# Patient Record
Sex: Male | Born: 1975 | Race: Black or African American | Hispanic: No | Marital: Single | Smoking: Never smoker
Health system: Southern US, Community
[De-identification: ages and names within clinical notes are randomized; demographics above are authoritative.]

## PROBLEM LIST (undated history)

## (undated) DIAGNOSIS — F1290 Cannabis use, unspecified, uncomplicated: Secondary | ICD-10-CM

## (undated) DIAGNOSIS — I10 Essential (primary) hypertension: Secondary | ICD-10-CM

## (undated) DIAGNOSIS — S82142R Displaced bicondylar fracture of left tibia, subsequent encounter for open fracture type IIIA, IIIB, or IIIC with malunion: Secondary | ICD-10-CM

## (undated) DIAGNOSIS — M24572 Contracture, left ankle: Secondary | ICD-10-CM

---

## 2014-04-15 ENCOUNTER — Encounter (HOSPITAL_COMMUNITY): Payer: Self-pay | Admitting: Emergency Medicine

## 2014-04-15 ENCOUNTER — Emergency Department (HOSPITAL_COMMUNITY)
Admission: EM | Admit: 2014-04-15 | Discharge: 2014-04-15 | Disposition: A | Discharge status: Home / Self Care | Payer: Self-pay | Attending: Emergency Medicine | Admitting: Emergency Medicine

## 2014-04-15 ENCOUNTER — Emergency Department (HOSPITAL_COMMUNITY): Payer: Self-pay

## 2014-04-15 DIAGNOSIS — S0003XA Contusion of scalp, initial encounter: Secondary | ICD-10-CM | POA: Insufficient documentation

## 2014-04-15 DIAGNOSIS — S1093XA Contusion of unspecified part of neck, initial encounter: Principal | ICD-10-CM

## 2014-04-15 DIAGNOSIS — S0083XA Contusion of other part of head, initial encounter: Secondary | ICD-10-CM | POA: Insufficient documentation

## 2014-04-15 DIAGNOSIS — F17200 Nicotine dependence, unspecified, uncomplicated: Secondary | ICD-10-CM | POA: Insufficient documentation

## 2014-04-15 DIAGNOSIS — Z23 Encounter for immunization: Secondary | ICD-10-CM | POA: Insufficient documentation

## 2014-04-15 MED ORDER — TETANUS-DIPHTH-ACELL PERTUSSIS 5-2.5-18.5 LF-MCG/0.5 IM SUSP
0.5000 mL | Freq: Once | INTRAMUSCULAR | Status: DC
  Filled 2014-04-15: qty 0.5

## 2014-04-15 MED ORDER — HYDROCODONE-ACETAMINOPHEN 5-325 MG PO TABS
1.0000 | ORAL_TABLET | Freq: Once | ORAL | Status: DC
  Filled 2014-04-15: qty 1

## 2014-04-15 MED ORDER — HYDROCODONE-ACETAMINOPHEN 5-325 MG PO TABS: 1.0000 | ORAL_TABLET | Freq: Four times a day (QID) | ORAL | Status: DC | PRN

## 2014-04-15 NOTE — ED Notes (Signed)
Pt offered a wheelchair at discharge, refused, this RN escorted to lobby.  Pt alert and oriented at discharge with steady gait.  Pt family to give ride home.
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
## 2014-04-15 NOTE — ED Notes (Signed)
Pt paged x2, no answer

## 2014-08-09 ENCOUNTER — Emergency Department (HOSPITAL_COMMUNITY)
Admission: EM | Admit: 2014-08-09 | Discharge: 2014-08-09 | Disposition: A | Discharge status: Home / Self Care | Payer: Self-pay | Attending: Emergency Medicine | Admitting: Emergency Medicine

## 2014-08-09 ENCOUNTER — Emergency Department (HOSPITAL_COMMUNITY): Payer: Self-pay

## 2014-08-09 ENCOUNTER — Encounter (HOSPITAL_COMMUNITY): Payer: Self-pay | Admitting: Emergency Medicine

## 2014-08-09 DIAGNOSIS — Y9289 Other specified places as the place of occurrence of the external cause: Secondary | ICD-10-CM | POA: Insufficient documentation

## 2014-08-09 DIAGNOSIS — M25521 Pain in right elbow: Secondary | ICD-10-CM

## 2014-08-09 DIAGNOSIS — I10 Essential (primary) hypertension: Secondary | ICD-10-CM | POA: Insufficient documentation

## 2014-08-09 DIAGNOSIS — W01198A Fall on same level from slipping, tripping and stumbling with subsequent striking against other object, initial encounter: Secondary | ICD-10-CM | POA: Insufficient documentation

## 2014-08-09 DIAGNOSIS — Y9389 Activity, other specified: Secondary | ICD-10-CM | POA: Insufficient documentation

## 2014-08-09 DIAGNOSIS — Z720 Tobacco use: Secondary | ICD-10-CM | POA: Insufficient documentation

## 2014-08-09 DIAGNOSIS — S59902A Unspecified injury of left elbow, initial encounter: Secondary | ICD-10-CM | POA: Insufficient documentation

## 2014-08-09 MED ORDER — OXYCODONE-ACETAMINOPHEN 5-325 MG PO TABS
1.0000 | ORAL_TABLET | Freq: Once | ORAL | Status: AC
  Administered 2014-08-09: 1 via ORAL
  Filled 2014-08-09: qty 1

## 2014-08-09 MED ORDER — ONDANSETRON HCL 4 MG PO TABS: 4.0000 mg | ORAL_TABLET | Freq: Four times a day (QID) | ORAL | Status: DC

## 2014-08-09 MED ORDER — OXYCODONE-ACETAMINOPHEN 5-325 MG PO TABS
1.0000 | ORAL_TABLET | ORAL | Status: DC | PRN
Start: 2014-08-09 — End: 2016-01-30
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
## 2014-08-09 NOTE — ED Notes (Addendum)
Ortho tech notified of arm splint order.

## 2014-08-09 NOTE — Discharge Instructions (Signed)
Today your x-ray showed that you have swelling to your elbow. I have concerns that there is a fracture that we cannot see on the x-ray. You were placed in a splint in the emergency department. Please try keep your arm elevated as much as possible. Take the pain medication given by mouth for relief of her symptoms. Do not drive, operate heavy machinery, combine this medication with alcohol. Followup with the orthopedic doctor listed above. Return to the emergency department for worsening pain, numbness, or any symptom that is concerning to you.    Hypertension Hypertension is another name for high blood pressure. High blood pressure forces your heart to work harder to pump blood. A blood pressure reading has two numbers, which includes a higher number over a lower number (example: 110/72). HOME CARE   Have your blood pressure rechecked by your doctor.  Only take medicine as told by your doctor. Follow the directions carefully. The medicine does not work as well if you skip doses. Skipping doses also puts you at risk for problems.  Do not smoke.  Monitor your blood pressure at home as told by your doctor. GET HELP IF:  You think you are having a reaction to the medicine you are taking.  You have repeat headaches or feel dizzy.  You have puffiness (swelling) in your ankles.  You have trouble with your vision. GET HELP RIGHT AWAY IF:   You get a very bad headache and are confused.  You feel weak, numb, or faint.  You get chest or belly (abdominal) pain.  You throw up (vomit).  You cannot breathe very well. MAKE SURE YOU:   Understand these instructions.  Will watch your condition.  Will get help right away if you are not doing well or get worse. Document Released: 03/30/2008 Document Revised: 10/17/2013 Document Reviewed: 08/04/2013 [facility] Patient Information 2015 [place], [place]. This information is not intended to replace advice given to you by your health care provider.  Make sure you discuss any questions you have with your health care provider.
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
## 2014-08-09 NOTE — Progress Notes (Signed)
Orthopedic Tech Progress Note Patient Details:  [place] Code 1976-06-24 [id number] Applied to RUE. Tolerated well. Ortho Devices Type of Ortho Device: Ace wrap;Arm sling;Post (long arm) splint Ortho Device/Splint Location: RUE Ortho Device/Splint Interventions: Application   Asia R Thompson 08/09/2014, 10:46 AM

## 2014-08-09 NOTE — ED Provider Notes (Signed)
Medical screening examination/treatment/procedure(s) were performed by non-physician practitioner and as supervising physician I was immediately available for consultation/collaboration.   EKG Interpretation None        Wandra Arthurs, MD 08/09/14 1531

## 2014-08-09 NOTE — ED Notes (Signed)
Declined W/C at D/C and was escorted to lobby by RN. 

## 2015-09-07 ENCOUNTER — Inpatient Hospital Stay (HOSPITAL_COMMUNITY): Payer: Self-pay

## 2015-09-07 ENCOUNTER — Inpatient Hospital Stay (HOSPITAL_COMMUNITY): Payer: Self-pay | Admitting: Anesthesiology

## 2015-09-07 ENCOUNTER — Emergency Department (HOSPITAL_COMMUNITY): Payer: Self-pay

## 2015-09-07 ENCOUNTER — Inpatient Hospital Stay (HOSPITAL_COMMUNITY): Payer: MEDICAID

## 2015-09-07 ENCOUNTER — Inpatient Hospital Stay (HOSPITAL_COMMUNITY)
Admission: EM | Admit: 2015-09-07 | Discharge: 2015-10-03 | DRG: 493 | Disposition: A | Discharge status: Home / Self Care | Payer: Self-pay | Attending: General Surgery | Admitting: General Surgery

## 2015-09-07 ENCOUNTER — Encounter (HOSPITAL_COMMUNITY): Admission: EM | Disposition: A | Discharge status: Home / Self Care | Payer: Self-pay | Source: Home / Self Care

## 2015-09-07 ENCOUNTER — Encounter (HOSPITAL_COMMUNITY): Payer: Self-pay | Admitting: *Deleted

## 2015-09-07 DIAGNOSIS — K567 Ileus, unspecified: Secondary | ICD-10-CM

## 2015-09-07 DIAGNOSIS — R339 Retention of urine, unspecified: Secondary | ICD-10-CM | POA: Diagnosis not present

## 2015-09-07 DIAGNOSIS — D473 Essential (hemorrhagic) thrombocythemia: Secondary | ICD-10-CM | POA: Diagnosis not present

## 2015-09-07 DIAGNOSIS — S21132A Puncture wound without foreign body of left front wall of thorax without penetration into thoracic cavity, initial encounter: Secondary | ICD-10-CM

## 2015-09-07 DIAGNOSIS — S41132A Puncture wound without foreign body of left upper arm, initial encounter: Secondary | ICD-10-CM

## 2015-09-07 DIAGNOSIS — Z419 Encounter for procedure for purposes other than remedying health state, unspecified: Secondary | ICD-10-CM

## 2015-09-07 DIAGNOSIS — D62 Acute posthemorrhagic anemia: Secondary | ICD-10-CM | POA: Diagnosis not present

## 2015-09-07 DIAGNOSIS — S82109A Unspecified fracture of upper end of unspecified tibia, initial encounter for closed fracture: Secondary | ICD-10-CM

## 2015-09-07 DIAGNOSIS — S21139A Puncture wound without foreign body of unspecified front wall of thorax without penetration into thoracic cavity, initial encounter: Secondary | ICD-10-CM

## 2015-09-07 DIAGNOSIS — S82832A Other fracture of upper and lower end of left fibula, initial encounter for closed fracture: Secondary | ICD-10-CM | POA: Diagnosis present

## 2015-09-07 DIAGNOSIS — S81832A Puncture wound without foreign body, left lower leg, initial encounter: Secondary | ICD-10-CM

## 2015-09-07 DIAGNOSIS — S82202A Unspecified fracture of shaft of left tibia, initial encounter for closed fracture: Secondary | ICD-10-CM

## 2015-09-07 DIAGNOSIS — S41131A Puncture wound without foreign body of right upper arm, initial encounter: Secondary | ICD-10-CM

## 2015-09-07 DIAGNOSIS — Z09 Encounter for follow-up examination after completed treatment for conditions other than malignant neoplasm: Secondary | ICD-10-CM

## 2015-09-07 DIAGNOSIS — W3400XA Accidental discharge from unspecified firearms or gun, initial encounter: Secondary | ICD-10-CM

## 2015-09-07 DIAGNOSIS — Z590 Homelessness: Secondary | ICD-10-CM

## 2015-09-07 DIAGNOSIS — S82142A Displaced bicondylar fracture of left tibia, initial encounter for closed fracture: Principal | ICD-10-CM | POA: Diagnosis present

## 2015-09-07 DIAGNOSIS — R000 Tachycardia, unspecified: Secondary | ICD-10-CM

## 2015-09-07 DIAGNOSIS — S82143A Displaced bicondylar fracture of unspecified tibia, initial encounter for closed fracture: Secondary | ICD-10-CM

## 2015-09-07 DIAGNOSIS — T148XXA Other injury of unspecified body region, initial encounter: Secondary | ICD-10-CM

## 2015-09-07 DIAGNOSIS — S82839A Other fracture of upper and lower end of unspecified fibula, initial encounter for closed fracture: Secondary | ICD-10-CM

## 2015-09-07 HISTORY — PX: EXTERNAL FIXATION LEG: SHX1549

## 2015-09-07 LAB — I-STAT CHEM 8, ED
BUN: 15 mg/dL (ref 6–20)
CHLORIDE: 106 mmol/L (ref 101–111)
CREATININE: 1.2 mg/dL (ref 0.61–1.24)
Calcium, Ion: 1.08 mmol/L — ABNORMAL LOW (ref 1.12–1.23)
GLUCOSE: 102 mg/dL — AB (ref 65–99)
HCT: 45 % (ref 39.0–52.0)
Hemoglobin: 15.3 g/dL (ref 13.0–17.0)
POTASSIUM: 3.2 mmol/L — AB (ref 3.5–5.1)
Sodium: 142 mmol/L (ref 135–145)
TCO2: 15 mmol/L (ref 0–100)

## 2015-09-07 LAB — TYPE AND SCREEN
ABO/RH(D): O POS
Antibody Screen: NEGATIVE
UNIT DIVISION: 0
Unit division: 0

## 2015-09-07 LAB — CBC
HEMATOCRIT: 43.9 % (ref 39.0–52.0)
Hemoglobin: 14.9 g/dL (ref 13.0–17.0)
MCH: 30.5 pg (ref 26.0–34.0)
MCHC: 33.9 g/dL (ref 30.0–36.0)
MCV: 90 fL (ref 78.0–100.0)
PLATELETS: 265 10*3/uL (ref 150–400)
RBC: 4.88 MIL/uL (ref 4.22–5.81)
RDW: 13 % (ref 11.5–15.5)
WBC: 19.7 10*3/uL — AB (ref 4.0–10.5)

## 2015-09-07 LAB — COMPREHENSIVE METABOLIC PANEL
ALT: 28 U/L (ref 17–63)
ANION GAP: 20 — AB (ref 5–15)
AST: 59 U/L — ABNORMAL HIGH (ref 15–41)
Albumin: 4.1 g/dL (ref 3.5–5.0)
Alkaline Phosphatase: 89 U/L (ref 38–126)
BILIRUBIN TOTAL: 1.2 mg/dL (ref 0.3–1.2)
BUN: 13 mg/dL (ref 6–20)
CO2: 12 mmol/L — ABNORMAL LOW (ref 22–32)
Calcium: 8.4 mg/dL — ABNORMAL LOW (ref 8.9–10.3)
Chloride: 108 mmol/L (ref 101–111)
Creatinine, Ser: 1.14 mg/dL (ref 0.61–1.24)
Glucose, Bld: 102 mg/dL — ABNORMAL HIGH (ref 65–99)
POTASSIUM: 4 mmol/L (ref 3.5–5.1)
Sodium: 140 mmol/L (ref 135–145)
TOTAL PROTEIN: 7.4 g/dL (ref 6.5–8.1)

## 2015-09-07 LAB — PREPARE FRESH FROZEN PLASMA
UNIT DIVISION: 0
Unit division: 0

## 2015-09-07 LAB — CDS SEROLOGY

## 2015-09-07 LAB — ABO/RH: ABO/RH(D): O POS

## 2015-09-07 LAB — SURGICAL PCR SCREEN
MRSA, PCR: NEGATIVE
Staphylococcus aureus: NEGATIVE

## 2015-09-07 LAB — PROTIME-INR
INR: 1.13 (ref 0.00–1.49)
Prothrombin Time: 14.6 seconds (ref 11.6–15.2)

## 2015-09-07 LAB — ETHANOL: Alcohol, Ethyl (B): 95 mg/dL — ABNORMAL HIGH (ref <5)

## 2015-09-07 LAB — BLOOD PRODUCT ORDER (VERBAL) VERIFICATION

## 2015-09-07 SURGERY — EXTERNAL FIXATION, LOWER EXTREMITY
Anesthesia: General | Laterality: Left

## 2015-09-07 MED ORDER — HYDROMORPHONE HCL 1 MG/ML IJ SOLN
0.2500 mg | INTRAMUSCULAR | Status: DC | PRN
  Administered 2015-09-07 (×4): 0.5 mg via INTRAVENOUS

## 2015-09-07 MED ORDER — MORPHINE SULFATE (PF) 4 MG/ML IV SOLN
4.0000 mg | Freq: Once | INTRAVENOUS | Status: AC
  Administered 2015-09-07: 4 mg via INTRAVENOUS
  Filled 2015-09-07: qty 1

## 2015-09-07 MED ORDER — ONDANSETRON HCL 4 MG PO TABS: 4.0000 mg | ORAL_TABLET | Freq: Four times a day (QID) | ORAL | Status: DC | PRN

## 2015-09-07 MED ORDER — SUCCINYLCHOLINE CHLORIDE 20 MG/ML IJ SOLN
INTRAMUSCULAR | Status: DC | PRN
  Administered 2015-09-07: 120 mg via INTRAVENOUS

## 2015-09-07 MED ORDER — PROPOFOL 10 MG/ML IV BOLUS
INTRAVENOUS | Status: AC
  Filled 2015-09-07: qty 20

## 2015-09-07 MED ORDER — SCOPOLAMINE 1 MG/3DAYS TD PT72
MEDICATED_PATCH | TRANSDERMAL | Status: AC
  Filled 2015-09-07: qty 1

## 2015-09-07 MED ORDER — PANTOPRAZOLE SODIUM 40 MG IV SOLR
40.0000 mg | Freq: Every day | INTRAVENOUS | Status: DC
  Administered 2015-09-07 – 2015-09-09 (×3): 40 mg via INTRAVENOUS
  Filled 2015-09-07 (×3): qty 40

## 2015-09-07 MED ORDER — SUCCINYLCHOLINE CHLORIDE 20 MG/ML IJ SOLN
INTRAMUSCULAR | Status: AC
  Filled 2015-09-07: qty 1

## 2015-09-07 MED ORDER — LIDOCAINE HCL (CARDIAC) 20 MG/ML IV SOLN
INTRAVENOUS | Status: AC
  Filled 2015-09-07: qty 5

## 2015-09-07 MED ORDER — HYDROMORPHONE HCL 1 MG/ML IJ SOLN
2.0000 mg | INTRAMUSCULAR | Status: DC | PRN
  Administered 2015-09-07: 2 mg via INTRAVENOUS
  Administered 2015-09-07: 1 mg via INTRAVENOUS
  Administered 2015-09-08: 2 mg via INTRAVENOUS
  Filled 2015-09-07 (×3): qty 2

## 2015-09-07 MED ORDER — PANTOPRAZOLE SODIUM 40 MG PO TBEC: 40.0000 mg | DELAYED_RELEASE_TABLET | Freq: Every day | ORAL | Status: DC

## 2015-09-07 MED ORDER — CEFAZOLIN SODIUM-DEXTROSE 2-3 GM-% IV SOLR
2.0000 g | Freq: Once | INTRAVENOUS | Status: AC
  Administered 2015-09-07: 2 g via INTRAVENOUS
  Filled 2015-09-07: qty 50

## 2015-09-07 MED ORDER — CEFAZOLIN SODIUM-DEXTROSE 2-3 GM-% IV SOLR
INTRAVENOUS | Status: AC
  Administered 2015-09-07: 2 g via INTRAVENOUS
  Filled 2015-09-07: qty 50

## 2015-09-07 MED ORDER — LACTATED RINGERS IV SOLN
INTRAVENOUS | Status: DC
  Administered 2015-09-07: 13:00:00 via INTRAVENOUS

## 2015-09-07 MED ORDER — ONDANSETRON HCL 4 MG/2ML IJ SOLN
INTRAMUSCULAR | Status: DC | PRN
  Administered 2015-09-07: 4 mg via INTRAVENOUS

## 2015-09-07 MED ORDER — HYDROMORPHONE HCL 1 MG/ML IJ SOLN: 0.5000 mg | INTRAMUSCULAR | Status: DC | PRN

## 2015-09-07 MED ORDER — ONDANSETRON HCL 4 MG/2ML IJ SOLN
INTRAMUSCULAR | Status: AC
  Filled 2015-09-07: qty 2

## 2015-09-07 MED ORDER — ONDANSETRON HCL 4 MG/2ML IJ SOLN: 4.0000 mg | Freq: Four times a day (QID) | INTRAMUSCULAR | Status: DC | PRN

## 2015-09-07 MED ORDER — CEFAZOLIN SODIUM 1-5 GM-% IV SOLN
1.0000 g | Freq: Three times a day (TID) | INTRAVENOUS | Status: AC
  Administered 2015-09-07 – 2015-09-13 (×19): 1 g via INTRAVENOUS
  Filled 2015-09-07 (×22): qty 50

## 2015-09-07 MED ORDER — FENTANYL CITRATE (PF) 250 MCG/5ML IJ SOLN
INTRAMUSCULAR | Status: AC
  Filled 2015-09-07: qty 5

## 2015-09-07 MED ORDER — MORPHINE SULFATE (PF) 2 MG/ML IV SOLN
INTRAVENOUS | Status: AC
  Administered 2015-09-07: 4 mg via INTRAVENOUS
  Filled 2015-09-07: qty 2

## 2015-09-07 MED ORDER — MIDAZOLAM HCL 2 MG/2ML IJ SOLN
INTRAMUSCULAR | Status: DC | PRN
  Administered 2015-09-07: 2 mg via INTRAVENOUS

## 2015-09-07 MED ORDER — HYDROMORPHONE HCL 1 MG/ML IJ SOLN
1.0000 mg | INTRAMUSCULAR | Status: DC | PRN
  Administered 2015-09-07 – 2015-09-08 (×6): 1 mg via INTRAVENOUS
  Filled 2015-09-07 (×5): qty 1

## 2015-09-07 MED ORDER — 0.9 % SODIUM CHLORIDE (POUR BTL) OPTIME
TOPICAL | Status: DC | PRN
  Administered 2015-09-07: 500 mL

## 2015-09-07 MED ORDER — MIDAZOLAM HCL 2 MG/2ML IJ SOLN
INTRAMUSCULAR | Status: AC
  Filled 2015-09-07: qty 4

## 2015-09-07 MED ORDER — HYDROMORPHONE HCL 1 MG/ML IJ SOLN
INTRAMUSCULAR | Status: AC
  Administered 2015-09-07: 0.5 mg via INTRAVENOUS
  Filled 2015-09-07: qty 1

## 2015-09-07 MED ORDER — PROPOFOL 10 MG/ML IV BOLUS
INTRAVENOUS | Status: DC | PRN
  Administered 2015-09-07: 160 mg via INTRAVENOUS

## 2015-09-07 MED ORDER — IOHEXOL 350 MG/ML SOLN
100.0000 mL | Freq: Once | INTRAVENOUS | Status: AC | PRN
  Administered 2015-09-07: 100 mL via INTRAVENOUS

## 2015-09-07 MED ORDER — FENTANYL CITRATE (PF) 100 MCG/2ML IJ SOLN
INTRAMUSCULAR | Status: DC | PRN
  Administered 2015-09-07: 100 ug via INTRAVENOUS
  Administered 2015-09-07 (×3): 50 ug via INTRAVENOUS

## 2015-09-07 MED ORDER — PROMETHAZINE HCL 25 MG/ML IJ SOLN: 6.2500 mg | INTRAMUSCULAR | Status: DC | PRN

## 2015-09-07 MED ORDER — TETANUS-DIPHTH-ACELL PERTUSSIS 5-2.5-18.5 LF-MCG/0.5 IM SUSP
0.5000 mL | Freq: Once | INTRAMUSCULAR | Status: AC
  Administered 2015-09-07: 0.5 mL via INTRAMUSCULAR
  Filled 2015-09-07: qty 0.5

## 2015-09-07 MED ORDER — LIDOCAINE HCL (CARDIAC) 20 MG/ML IV SOLN
INTRAVENOUS | Status: DC | PRN
  Administered 2015-09-07: 60 mg via INTRAVENOUS

## 2015-09-07 MED ORDER — LACTATED RINGERS IV SOLN
INTRAVENOUS | Status: DC | PRN
  Administered 2015-09-07 (×2): via INTRAVENOUS

## 2015-09-07 MED ORDER — KCL IN DEXTROSE-NACL 20-5-0.45 MEQ/L-%-% IV SOLN
INTRAVENOUS | Status: DC
  Administered 2015-09-07 – 2015-09-08 (×2): via INTRAVENOUS
  Administered 2015-09-08 – 2015-09-09 (×2): 100 mL/h via INTRAVENOUS
  Administered 2015-09-09 – 2015-09-10 (×2): via INTRAVENOUS
  Filled 2015-09-07 (×5): qty 1000

## 2015-09-07 SURGICAL SUPPLY — 56 items
5MM HALF PIN X200X45 ×2 IMPLANT
AIRSTRIP 14 3/4X4 3/4 7190 (GAUZE/BANDAGES/DRESSINGS) ×2 IMPLANT
BANDAGE ELASTIC 4 VELCRO ST LF (GAUZE/BANDAGES/DRESSINGS) ×4 IMPLANT
BANDAGE ELASTIC 6 VELCRO ST LF (GAUZE/BANDAGES/DRESSINGS) ×2 IMPLANT
BAR GLASS FIBER EXFX 11X600 (MISCELLANEOUS) ×4 IMPLANT
BIT DRILL CANN MED FLUTE 4.0 (BIT) ×1 IMPLANT
BLADE SURG 10 STRL SS (BLADE) ×2 IMPLANT
BLADE SURG 15 STRL LF DISP TIS (BLADE) ×1 IMPLANT
BLADE SURG 15 STRL SS (BLADE) ×1
BNDG COHESIVE 4X5 TAN STRL (GAUZE/BANDAGES/DRESSINGS) ×2 IMPLANT
BNDG GAUZE ELAST 4 BULKY (GAUZE/BANDAGES/DRESSINGS) ×2 IMPLANT
BOVIE (MISCELLANEOUS) ×2 IMPLANT
DRAPE C-ARM 42X72 X-RAY (DRAPES) IMPLANT
DRAPE INCISE IOBAN 66X45 STRL (DRAPES) IMPLANT
DRAPE ORTHO SPLIT 77X108 STRL (DRAPES) ×2
DRAPE PROXIMA HALF (DRAPES) ×4 IMPLANT
DRAPE SURG ORHT 6 SPLT 77X108 (DRAPES) ×2 IMPLANT
DRAPE U-SHAPE 47X51 STRL (DRAPES) ×2 IMPLANT
DRILL CANN 4.0MM (BIT) ×2
DRSG ADAPTIC 3X8 NADH LF (GAUZE/BANDAGES/DRESSINGS) ×2 IMPLANT
DRSG EMULSION OIL 3X3 NADH (GAUZE/BANDAGES/DRESSINGS) ×2 IMPLANT
DRSG PAD ABDOMINAL 8X10 ST (GAUZE/BANDAGES/DRESSINGS) ×4 IMPLANT
DRSG VAC ATS MED SENSATRAC (GAUZE/BANDAGES/DRESSINGS) ×2 IMPLANT
DURAPREP 26ML APPLICATOR (WOUND CARE) ×2 IMPLANT
ELECT REM PT RETURN 9FT ADLT (ELECTROSURGICAL) ×2
ELECTRODE REM PT RTRN 9FT ADLT (ELECTROSURGICAL) ×1 IMPLANT
GAUZE XEROFORM 5X9 LF (GAUZE/BANDAGES/DRESSINGS) ×2 IMPLANT
GLOVE BIOGEL PI IND STRL 8 (GLOVE) ×2 IMPLANT
GLOVE BIOGEL PI INDICATOR 8 (GLOVE) ×2
GLOVE ECLIPSE 7.5 STRL STRAW (GLOVE) ×4 IMPLANT
GOWN STRL REUS W/ TWL LRG LVL3 (GOWN DISPOSABLE) ×1 IMPLANT
GOWN STRL REUS W/ TWL XL LVL3 (GOWN DISPOSABLE) ×2 IMPLANT
GOWN STRL REUS W/TWL LRG LVL3 (GOWN DISPOSABLE) ×1
GOWN STRL REUS W/TWL XL LVL3 (GOWN DISPOSABLE) ×2
KIT BASIN OR (CUSTOM PROCEDURE TRAY) ×2 IMPLANT
KIT ROOM TURNOVER OR (KITS) ×2 IMPLANT
MANIFOLD NEPTUNE II (INSTRUMENTS) ×2 IMPLANT
NS IRRIG 1000ML POUR BTL (IV SOLUTION) ×2 IMPLANT
PACK SURGICAL SETUP 50X90 (CUSTOM PROCEDURE TRAY) ×2 IMPLANT
PAD ARMBOARD 7.5X6 YLW CONV (MISCELLANEOUS) ×4 IMPLANT
PAD CAST 4YDX4 CTTN HI CHSV (CAST SUPPLIES) ×1 IMPLANT
PADDING CAST ABS 4INX4YD NS (CAST SUPPLIES) ×2
PADDING CAST ABS COTTON 4X4 ST (CAST SUPPLIES) ×2 IMPLANT
PADDING CAST COTTON 4X4 STRL (CAST SUPPLIES) ×1
PIN CLAMP 2BAR 75MM BLUE (PIN) ×4 IMPLANT
PIN HALF ORANGE 5X200X45MM (Pin) ×2 IMPLANT
PIN HALF YELLOW 5X160X35 (PIN) ×4 IMPLANT
SPONGE GAUZE 4X4 12PLY STER LF (GAUZE/BANDAGES/DRESSINGS) ×2 IMPLANT
SPONGE LAP 18X18 X RAY DECT (DISPOSABLE) ×2 IMPLANT
STOCKINETTE IMPERVIOUS 9X36 MD (GAUZE/BANDAGES/DRESSINGS) ×2 IMPLANT
SUCTION FRAZIER TIP 10 FR DISP (SUCTIONS) ×2 IMPLANT
TOWEL OR 17X24 6PK STRL BLUE (TOWEL DISPOSABLE) ×2 IMPLANT
TOWEL OR 17X26 10 PK STRL BLUE (TOWEL DISPOSABLE) ×2 IMPLANT
TUBE CONNECTING 12X1/4 (SUCTIONS) ×2 IMPLANT
WATER STERILE IRR 1000ML POUR (IV SOLUTION) ×2 IMPLANT
YANKAUER SUCT BULB TIP NO VENT (SUCTIONS) IMPLANT

## 2015-09-07 NOTE — ED Notes (Signed)
Port chest x-ray done
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
## 2015-09-07 NOTE — ED Notes (Signed)
Plain films  done

## 2015-09-07 NOTE — ED Notes (Signed)
To ct
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
## 2015-09-07 NOTE — Anesthesia Procedure Notes (Signed)
Procedure Name: Intubation Date/Time: 09/07/2015 2:29 PM Performed by: Marinda Elk A Pre-anesthesia Checklist: Timeout performed, Patient identified, Emergency Drugs available, Patient being monitored and Suction available Patient Re-evaluated:Patient Re-evaluated prior to inductionOxygen Delivery Method: Circle system utilized Preoxygenation: Pre-oxygenation with 100% oxygen Intubation Type: IV induction Laryngoscope Size: Glidescope Tube type: Oral Tube size: 7.5 mm Number of attempts: 1 Airway Equipment and Method: Stylet and Video-laryngoscopy Placement Confirmation: ETT inserted through vocal cords under direct vision,  breath sounds checked- equal and bilateral and positive ETCO2 Secured at: 22 cm Tube secured with: Tape Dental Injury: Teeth and Oropharynx as per pre-operative assessment

## 2015-09-07 NOTE — Progress Notes (Signed)
Pt. BP elevated to 180's/110's, verified in L.arm/R.leg,HR 120's,  pt alert and oriented, moaning and reports continued 10/10 pain, D.Massaggee notified, no new orders given, instructed to return patient to room for continued pain asessments/medication on floor, will cont to assess.
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
## 2015-09-07 NOTE — ED Notes (Signed)
Morphine 4mg  given sara rn
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
## 2015-09-07 NOTE — Progress Notes (Signed)
Orthopedic Tech Progress Note Patient Details:  Christian Branch Nov 09, 1975 [id number]  Ortho Devices Type of Ortho Device: Ace wrap, Long leg splint Ortho Device/Splint Location: lle Ortho Device/Splint Interventions: Application   Diem Pagnotta 09/07/2015, 9:50 AM
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
## 2015-09-07 NOTE — ED Notes (Signed)
Pt returned from ct

## 2015-09-07 NOTE — Transfer of Care (Signed)
Immediate Anesthesia Transfer of Care Note  Patient: Christian Branch  Procedure(s) Performed: Procedure(s): EXTERNAL FIXATION LEFT LOWER LEG WITH COMPARTMENT RELEASES (Left)  Patient Location: PACU  Anesthesia Type:General  Level of Consciousness: awake  Airway & Oxygen Therapy: Patient Spontanous Breathing and Patient connected to nasal cannula oxygen  Post-op Assessment: Report given to RN and Post -op Vital signs reviewed and stable  Post vital signs: Reviewed and stable  Last Vitals:  Filed Vitals:   09/07/15 1635  BP: 173/108  Pulse: 131  Temp: 36.6 C  Resp: 20    Complications: No apparent anesthesia complications
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
## 2015-09-08 LAB — CBC
HCT: 29.4 % — ABNORMAL LOW (ref 39.0–52.0)
HEMOGLOBIN: 10 g/dL — AB (ref 13.0–17.0)
MCH: 30.3 pg (ref 26.0–34.0)
MCHC: 34 g/dL (ref 30.0–36.0)
MCV: 89.1 fL (ref 78.0–100.0)
Platelets: 213 10*3/uL (ref 150–400)
RBC: 3.3 MIL/uL — AB (ref 4.22–5.81)
RDW: 13 % (ref 11.5–15.5)
WBC: 7.3 10*3/uL (ref 4.0–10.5)

## 2015-09-08 LAB — BASIC METABOLIC PANEL
ANION GAP: 9 (ref 5–15)
BUN: 12 mg/dL (ref 6–20)
CALCIUM: 8.1 mg/dL — AB (ref 8.9–10.3)
CHLORIDE: 102 mmol/L (ref 101–111)
CO2: 24 mmol/L (ref 22–32)
Creatinine, Ser: 1.21 mg/dL (ref 0.61–1.24)
GFR calc non Af Amer: >60 mL/min (ref >60)
Glucose, Bld: 161 mg/dL — ABNORMAL HIGH (ref 65–99)
Potassium: 3.7 mmol/L (ref 3.5–5.1)
Sodium: 135 mmol/L (ref 135–145)

## 2015-09-08 MED ORDER — DIPHENHYDRAMINE HCL 12.5 MG/5ML PO ELIX: 12.5000 mg | ORAL_SOLUTION | Freq: Four times a day (QID) | ORAL | Status: DC | PRN

## 2015-09-08 MED ORDER — NALOXONE HCL 0.4 MG/ML IJ SOLN: 0.4000 mg | INTRAMUSCULAR | Status: DC | PRN

## 2015-09-08 MED ORDER — DIPHENHYDRAMINE HCL 50 MG/ML IJ SOLN
12.5000 mg | Freq: Four times a day (QID) | INTRAMUSCULAR | Status: DC | PRN
  Administered 2015-09-09: 12.5 mg via INTRAVENOUS
  Filled 2015-09-08: qty 1

## 2015-09-08 MED ORDER — ONDANSETRON HCL 4 MG/2ML IJ SOLN: 4.0000 mg | Freq: Four times a day (QID) | INTRAMUSCULAR | Status: DC | PRN

## 2015-09-08 MED ORDER — KETOROLAC TROMETHAMINE 30 MG/ML IJ SOLN
30.0000 mg | Freq: Three times a day (TID) | INTRAMUSCULAR | Status: DC
  Administered 2015-09-08: 30 mg via INTRAVENOUS
  Filled 2015-09-08 (×3): qty 1

## 2015-09-08 MED ORDER — METHOCARBAMOL 1000 MG/10ML IJ SOLN: 500.0000 mg | Freq: Four times a day (QID) | INTRAVENOUS | Status: DC | PRN

## 2015-09-08 MED ORDER — SODIUM CHLORIDE 0.9 % IJ SOLN: 9.0000 mL | INTRAMUSCULAR | Status: DC | PRN

## 2015-09-08 MED ORDER — HYDROMORPHONE HCL 1 MG/ML IJ SOLN: 2.0000 mg | INTRAMUSCULAR | Status: DC | PRN

## 2015-09-08 MED ORDER — HYDROMORPHONE 1 MG/ML IV SOLN
INTRAVENOUS | Status: DC
  Administered 2015-09-08: 0.9 mg via INTRAVENOUS
  Administered 2015-09-08: 3 mg via INTRAVENOUS
  Administered 2015-09-08: 13:00:00 via INTRAVENOUS
  Administered 2015-09-09: 3 mg via INTRAVENOUS
  Administered 2015-09-09: 10:00:00 via INTRAVENOUS
  Administered 2015-09-09: 3 mg via INTRAVENOUS
  Filled 2015-09-08 (×2): qty 25

## 2015-09-08 NOTE — Progress Notes (Signed)
PT Cancellation Note  Patient Details Name: Christian Branch MRN: [id number] DOB: 09/10/1976   Cancelled Treatment:    Reason Eval/Treat Not Completed: Patient refusing to participate in PT at this time. Patient states that he is finally comfortable and does not want to move. Will follow as able.    Cassell Clement, PT, CSCS Pager [phone] Office [phone]  09/08/2015, 3:14 PM
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
## 2015-09-09 ENCOUNTER — Inpatient Hospital Stay (HOSPITAL_COMMUNITY): Payer: Self-pay

## 2015-09-09 ENCOUNTER — Encounter (HOSPITAL_COMMUNITY): Admission: EM | Disposition: A | Discharge status: Home / Self Care | Payer: Self-pay | Source: Home / Self Care

## 2015-09-09 ENCOUNTER — Encounter (HOSPITAL_COMMUNITY): Payer: Self-pay | Admitting: Orthopedic Surgery

## 2015-09-09 ENCOUNTER — Inpatient Hospital Stay (HOSPITAL_COMMUNITY): Payer: Self-pay | Admitting: Certified Registered Nurse Anesthetist

## 2015-09-09 DIAGNOSIS — S41132A Puncture wound without foreign body of left upper arm, initial encounter: Secondary | ICD-10-CM

## 2015-09-09 DIAGNOSIS — S41131A Puncture wound without foreign body of right upper arm, initial encounter: Secondary | ICD-10-CM

## 2015-09-09 DIAGNOSIS — W3400XA Accidental discharge from unspecified firearms or gun, initial encounter: Secondary | ICD-10-CM

## 2015-09-09 DIAGNOSIS — S21139A Puncture wound without foreign body of unspecified front wall of thorax without penetration into thoracic cavity, initial encounter: Secondary | ICD-10-CM

## 2015-09-09 DIAGNOSIS — S81832A Puncture wound without foreign body, left lower leg, initial encounter: Secondary | ICD-10-CM

## 2015-09-09 DIAGNOSIS — D62 Acute posthemorrhagic anemia: Secondary | ICD-10-CM | POA: Diagnosis not present

## 2015-09-09 HISTORY — PX: SECONDARY CLOSURE OF WOUND: SHX6208

## 2015-09-09 LAB — URINALYSIS, ROUTINE W REFLEX MICROSCOPIC
BILIRUBIN URINE: NEGATIVE
Glucose, UA: NEGATIVE mg/dL
KETONES UR: NEGATIVE mg/dL
Leukocytes, UA: NEGATIVE
NITRITE: NEGATIVE
PROTEIN: NEGATIVE mg/dL
SPECIFIC GRAVITY, URINE: 1.012 (ref 1.005–1.030)
UROBILINOGEN UA: 0.2 mg/dL (ref 0.0–1.0)
pH: 5 (ref 5.0–8.0)

## 2015-09-09 LAB — BASIC METABOLIC PANEL
ANION GAP: 5 (ref 5–15)
BUN: 9 mg/dL (ref 6–20)
CALCIUM: 8.1 mg/dL — AB (ref 8.9–10.3)
CHLORIDE: 100 mmol/L — AB (ref 101–111)
CO2: 27 mmol/L (ref 22–32)
Creatinine, Ser: 1.06 mg/dL (ref 0.61–1.24)
GFR calc Af Amer: >60 mL/min (ref >60)
GFR calc non Af Amer: >60 mL/min (ref >60)
GLUCOSE: 130 mg/dL — AB (ref 65–99)
Potassium: 3.8 mmol/L (ref 3.5–5.1)
Sodium: 132 mmol/L — ABNORMAL LOW (ref 135–145)

## 2015-09-09 LAB — CBC
HEMATOCRIT: 26.7 % — AB (ref 39.0–52.0)
HEMOGLOBIN: 8.7 g/dL — AB (ref 13.0–17.0)
MCH: 29 pg (ref 26.0–34.0)
MCHC: 32.6 g/dL (ref 30.0–36.0)
MCV: 89 fL (ref 78.0–100.0)
Platelets: 215 10*3/uL (ref 150–400)
RBC: 3 MIL/uL — ABNORMAL LOW (ref 4.22–5.81)
RDW: 12.8 % (ref 11.5–15.5)
WBC: 9 10*3/uL (ref 4.0–10.5)

## 2015-09-09 LAB — URINE MICROSCOPIC-ADD ON

## 2015-09-09 SURGERY — SECONDARY CLOSURE OF WOUND
Anesthesia: General | Laterality: Left

## 2015-09-09 SURGERY — OPEN REDUCTION INTERNAL FIXATION (ORIF) TIBIA FRACTURE
Anesthesia: General | Laterality: Left

## 2015-09-09 MED ORDER — MEPERIDINE HCL 25 MG/ML IJ SOLN: 6.2500 mg | INTRAMUSCULAR | Status: DC | PRN

## 2015-09-09 MED ORDER — MEPERIDINE HCL 25 MG/ML IJ SOLN
INTRAMUSCULAR | Status: AC
  Administered 2015-09-09: 12.5 mg via INTRAVENOUS
  Filled 2015-09-09: qty 1

## 2015-09-09 MED ORDER — MIDAZOLAM HCL 2 MG/2ML IJ SOLN
INTRAMUSCULAR | Status: AC
  Filled 2015-09-09: qty 4

## 2015-09-09 MED ORDER — PROPOFOL 10 MG/ML IV BOLUS
INTRAVENOUS | Status: AC
  Filled 2015-09-09: qty 20

## 2015-09-09 MED ORDER — HYDROMORPHONE HCL 1 MG/ML IJ SOLN: 0.2500 mg | INTRAMUSCULAR | Status: DC | PRN

## 2015-09-09 MED ORDER — POLYETHYLENE GLYCOL 3350 17 G PO PACK
17.0000 g | PACK | Freq: Every day | ORAL | Status: DC
  Administered 2015-09-10 – 2015-09-16 (×6): 17 g via ORAL
  Filled 2015-09-09 (×6): qty 1

## 2015-09-09 MED ORDER — SODIUM CHLORIDE 0.9 % IR SOLN
Status: DC | PRN
  Administered 2015-09-09: 1000 mL

## 2015-09-09 MED ORDER — ROCURONIUM BROMIDE 100 MG/10ML IV SOLN
INTRAVENOUS | Status: DC | PRN
  Administered 2015-09-09: 40 mg via INTRAVENOUS

## 2015-09-09 MED ORDER — SUGAMMADEX SODIUM 200 MG/2ML IV SOLN
INTRAVENOUS | Status: DC | PRN
  Administered 2015-09-09: 150 mg via INTRAVENOUS

## 2015-09-09 MED ORDER — ONDANSETRON HCL 4 MG/2ML IJ SOLN
INTRAMUSCULAR | Status: DC | PRN
  Administered 2015-09-09: 4 mg via INTRAVENOUS

## 2015-09-09 MED ORDER — FENTANYL CITRATE (PF) 250 MCG/5ML IJ SOLN
INTRAMUSCULAR | Status: AC
  Filled 2015-09-09: qty 5

## 2015-09-09 MED ORDER — TRAMADOL HCL 50 MG PO TABS
100.0000 mg | ORAL_TABLET | Freq: Four times a day (QID) | ORAL | Status: DC
  Administered 2015-09-10 – 2015-10-03 (×89): 100 mg via ORAL
  Filled 2015-09-09 (×91): qty 2

## 2015-09-09 MED ORDER — DOCUSATE SODIUM 100 MG PO CAPS
100.0000 mg | ORAL_CAPSULE | Freq: Two times a day (BID) | ORAL | Status: DC
  Administered 2015-09-10 – 2015-09-11 (×3): 100 mg via ORAL
  Filled 2015-09-09 (×3): qty 1

## 2015-09-09 MED ORDER — PHENYLEPHRINE HCL 10 MG/ML IJ SOLN
INTRAMUSCULAR | Status: DC | PRN
  Administered 2015-09-09 (×2): 80 ug via INTRAVENOUS

## 2015-09-09 MED ORDER — MIDAZOLAM HCL 5 MG/5ML IJ SOLN
INTRAMUSCULAR | Status: DC | PRN
  Administered 2015-09-09: 2 mg via INTRAVENOUS

## 2015-09-09 MED ORDER — HYDROMORPHONE HCL 1 MG/ML IJ SOLN
INTRAMUSCULAR | Status: AC
  Administered 2015-09-09: 0.5 mg via INTRAVENOUS
  Filled 2015-09-09: qty 1

## 2015-09-09 MED ORDER — FENTANYL CITRATE (PF) 100 MCG/2ML IJ SOLN
INTRAMUSCULAR | Status: DC | PRN
  Administered 2015-09-09: 50 ug via INTRAVENOUS
  Administered 2015-09-09: 150 ug via INTRAVENOUS
  Administered 2015-09-09: 50 ug via INTRAVENOUS

## 2015-09-09 MED ORDER — LACTATED RINGERS IV SOLN
INTRAVENOUS | Status: DC
  Administered 2015-09-09 (×3): via INTRAVENOUS

## 2015-09-09 MED ORDER — OXYCODONE HCL 5 MG/5ML PO SOLN: 5.0000 mg | Freq: Once | ORAL | Status: DC | PRN

## 2015-09-09 MED ORDER — BUPIVACAINE HCL (PF) 0.25 % IJ SOLN
INTRAMUSCULAR | Status: DC | PRN
Start: 2015-09-09 — End: 2015-09-09
  Administered 2015-09-09: 10 mL

## 2015-09-09 MED ORDER — PROPOFOL 10 MG/ML IV BOLUS
INTRAVENOUS | Status: DC | PRN
  Administered 2015-09-09: 200 mg via INTRAVENOUS

## 2015-09-09 MED ORDER — SUGAMMADEX SODIUM 200 MG/2ML IV SOLN
INTRAVENOUS | Status: AC
  Filled 2015-09-09: qty 2

## 2015-09-09 MED ORDER — OXYCODONE HCL 5 MG PO TABS
10.0000 mg | ORAL_TABLET | ORAL | Status: DC | PRN
  Administered 2015-09-09: 15 mg via ORAL
  Administered 2015-09-10 – 2015-09-11 (×7): 20 mg via ORAL
  Administered 2015-09-12: 15 mg via ORAL
  Administered 2015-09-12: 20 mg via ORAL
  Administered 2015-09-12: 15 mg via ORAL
  Administered 2015-09-12 – 2015-09-16 (×18): 20 mg via ORAL
  Administered 2015-09-16: 15 mg via ORAL
  Administered 2015-09-16 – 2015-09-27 (×44): 20 mg via ORAL
  Administered 2015-09-27: 15 mg via ORAL
  Administered 2015-09-28 – 2015-10-02 (×20): 20 mg via ORAL
  Administered 2015-10-02: 15 mg via ORAL
  Administered 2015-10-02 – 2015-10-03 (×2): 20 mg via ORAL
  Administered 2015-10-03: 15 mg via ORAL
  Administered 2015-10-03: 20 mg via ORAL
  Filled 2015-09-09 (×36): qty 4
  Filled 2015-09-09: qty 3
  Filled 2015-09-09 (×21): qty 4
  Filled 2015-09-09: qty 3
  Filled 2015-09-09 (×4): qty 4
  Filled 2015-09-09: qty 3
  Filled 2015-09-09 (×11): qty 4
  Filled 2015-09-09 (×2): qty 3
  Filled 2015-09-09 (×17): qty 4
  Filled 2015-09-09: qty 3
  Filled 2015-09-09 (×4): qty 4

## 2015-09-09 MED ORDER — HYDROMORPHONE HCL 1 MG/ML IJ SOLN
0.2500 mg | INTRAMUSCULAR | Status: DC | PRN
  Administered 2015-09-09 (×4): 0.5 mg via INTRAVENOUS

## 2015-09-09 MED ORDER — PROMETHAZINE HCL 25 MG/ML IJ SOLN: 6.2500 mg | INTRAMUSCULAR | Status: DC | PRN

## 2015-09-09 MED ORDER — OXYCODONE HCL 5 MG PO TABS: 5.0000 mg | ORAL_TABLET | Freq: Once | ORAL | Status: DC | PRN

## 2015-09-09 MED ORDER — BUPIVACAINE HCL (PF) 0.25 % IJ SOLN
INTRAMUSCULAR | Status: AC
  Filled 2015-09-09: qty 30

## 2015-09-09 MED ORDER — ENOXAPARIN SODIUM 40 MG/0.4ML ~~LOC~~ SOLN
40.0000 mg | SUBCUTANEOUS | Status: DC
  Administered 2015-09-10 – 2015-10-01 (×19): 40 mg via SUBCUTANEOUS
  Filled 2015-09-09 (×21): qty 0.4

## 2015-09-09 MED ORDER — HYDROMORPHONE HCL 1 MG/ML IJ SOLN
1.0000 mg | INTRAMUSCULAR | Status: DC | PRN
  Administered 2015-09-09 – 2015-09-13 (×10): 1 mg via INTRAVENOUS
  Filled 2015-09-09 (×11): qty 1

## 2015-09-09 MED ORDER — LIDOCAINE HCL (CARDIAC) 20 MG/ML IV SOLN
INTRAVENOUS | Status: DC | PRN
  Administered 2015-09-09: 80 mg via INTRAVENOUS

## 2015-09-09 MED ORDER — MEPERIDINE HCL 25 MG/ML IJ SOLN
6.2500 mg | INTRAMUSCULAR | Status: DC | PRN
  Administered 2015-09-09 (×2): 12.5 mg via INTRAVENOUS

## 2015-09-09 MED ORDER — METHOCARBAMOL 1000 MG/10ML IJ SOLN
1000.0000 mg | Freq: Four times a day (QID) | INTRAVENOUS | Status: DC
  Administered 2015-09-09 – 2015-09-13 (×15): 1000 mg via INTRAVENOUS
  Filled 2015-09-09 (×17): qty 10

## 2015-09-09 SURGICAL SUPPLY — 73 items
BANDAGE ELASTIC 4 VELCRO ST LF (GAUZE/BANDAGES/DRESSINGS) ×3 IMPLANT
BANDAGE ELASTIC 6 VELCRO ST LF (GAUZE/BANDAGES/DRESSINGS) ×3 IMPLANT
BANDAGE ESMARK 6X9 LF (GAUZE/BANDAGES/DRESSINGS) IMPLANT
BLADE SURG 10 STRL SS (BLADE) ×3 IMPLANT
BNDG COHESIVE 4X5 TAN STRL (GAUZE/BANDAGES/DRESSINGS) IMPLANT
BNDG COHESIVE 6X5 TAN STRL LF (GAUZE/BANDAGES/DRESSINGS) ×3 IMPLANT
BNDG ESMARK 6X9 LF (GAUZE/BANDAGES/DRESSINGS)
BNDG GAUZE ELAST 4 BULKY (GAUZE/BANDAGES/DRESSINGS) ×6 IMPLANT
BNDG GAUZE STRTCH 6 (GAUZE/BANDAGES/DRESSINGS) IMPLANT
BRUSH SCRUB DISP (MISCELLANEOUS) ×6 IMPLANT
CLEANER TIP ELECTROSURG 2X2 (MISCELLANEOUS) ×3 IMPLANT
CLOSURE WOUND 1/2 X4 (GAUZE/BANDAGES/DRESSINGS)
CONNECTOR Y ATS VAC SYSTEM (MISCELLANEOUS) ×3 IMPLANT
COVER SURGICAL LIGHT HANDLE (MISCELLANEOUS) ×3 IMPLANT
CUFF TOURNIQUET SINGLE 18IN (TOURNIQUET CUFF) IMPLANT
CUFF TOURNIQUET SINGLE 24IN (TOURNIQUET CUFF) IMPLANT
CUFF TOURNIQUET SINGLE 34IN LL (TOURNIQUET CUFF) IMPLANT
DRAPE C-ARM 42X72 X-RAY (DRAPES) ×3 IMPLANT
DRAPE C-ARMOR (DRAPES) ×3 IMPLANT
DRAPE U-SHAPE 47X51 STRL (DRAPES) ×3 IMPLANT
DRSG ADAPTIC 3X8 NADH LF (GAUZE/BANDAGES/DRESSINGS) IMPLANT
DRSG MEPITEL 4X7.2 (GAUZE/BANDAGES/DRESSINGS) ×3 IMPLANT
DRSG PAD ABDOMINAL 8X10 ST (GAUZE/BANDAGES/DRESSINGS) ×3 IMPLANT
DRSG VAC ATS MED SENSATRAC (GAUZE/BANDAGES/DRESSINGS) ×6 IMPLANT
ELECT CAUTERY BLADE 6.4 (BLADE) IMPLANT
ELECT REM PT RETURN 9FT ADLT (ELECTROSURGICAL) ×3
ELECTRODE REM PT RTRN 9FT ADLT (ELECTROSURGICAL) ×1 IMPLANT
EVACUATOR 1/8 PVC DRAIN (DRAIN) IMPLANT
GAUZE SPONGE 4X4 12PLY STRL (GAUZE/BANDAGES/DRESSINGS) ×6 IMPLANT
GLOVE BIO SURGEON STRL SZ7.5 (GLOVE) ×3 IMPLANT
GLOVE BIO SURGEON STRL SZ8 (GLOVE) ×3 IMPLANT
GLOVE BIOGEL PI IND STRL 7.5 (GLOVE) ×1 IMPLANT
GLOVE BIOGEL PI IND STRL 8 (GLOVE) ×1 IMPLANT
GLOVE BIOGEL PI INDICATOR 7.5 (GLOVE) ×2
GLOVE BIOGEL PI INDICATOR 8 (GLOVE) ×2
GOWN STRL REUS W/ TWL LRG LVL3 (GOWN DISPOSABLE) ×2 IMPLANT
GOWN STRL REUS W/ TWL XL LVL3 (GOWN DISPOSABLE) ×1 IMPLANT
GOWN STRL REUS W/TWL LRG LVL3 (GOWN DISPOSABLE) ×4
GOWN STRL REUS W/TWL XL LVL3 (GOWN DISPOSABLE) ×2
HANDPIECE INTERPULSE COAX TIP (DISPOSABLE)
KIT BASIN OR (CUSTOM PROCEDURE TRAY) ×3 IMPLANT
KIT ROOM TURNOVER OR (KITS) ×3 IMPLANT
MANIFOLD NEPTUNE II (INSTRUMENTS) ×3 IMPLANT
NEEDLE 22X1 1/2 (OR ONLY) (NEEDLE) ×3 IMPLANT
NS IRRIG 1000ML POUR BTL (IV SOLUTION) ×3 IMPLANT
PACK ORTHO EXTREMITY (CUSTOM PROCEDURE TRAY) ×3 IMPLANT
PAD ARMBOARD 7.5X6 YLW CONV (MISCELLANEOUS) ×6 IMPLANT
PAD NEG PRESSURE SENSATRAC (MISCELLANEOUS) ×3 IMPLANT
PADDING CAST COTTON 6X4 STRL (CAST SUPPLIES) IMPLANT
SET HNDPC FAN SPRY TIP SCT (DISPOSABLE) IMPLANT
SPONGE GAUZE 4X4 12PLY STER LF (GAUZE/BANDAGES/DRESSINGS) ×3 IMPLANT
SPONGE LAP 18X18 X RAY DECT (DISPOSABLE) ×3 IMPLANT
SPONGE SCRUB IODOPHOR (GAUZE/BANDAGES/DRESSINGS) ×3 IMPLANT
STAPLER VISISTAT 35W (STAPLE) IMPLANT
STOCKINETTE IMPERVIOUS 9X36 MD (GAUZE/BANDAGES/DRESSINGS) IMPLANT
STOCKINETTE IMPERVIOUS LG (DRAPES) ×3 IMPLANT
STRIP CLOSURE SKIN 1/2X4 (GAUZE/BANDAGES/DRESSINGS) IMPLANT
SUCTION FRAZIER TIP 10 FR DISP (SUCTIONS) IMPLANT
SUT ETHILON 3 0 PS 1 (SUTURE) IMPLANT
SUT PDS AB 2-0 CT1 27 (SUTURE) ×12 IMPLANT
SUT VIC AB 0 CT1 27 (SUTURE) ×4
SUT VIC AB 0 CT1 27XBRD ANBCTR (SUTURE) ×2 IMPLANT
SUT VIC AB 2-0 CT1 27 (SUTURE) ×4
SUT VIC AB 2-0 CT1 TAPERPNT 27 (SUTURE) ×2 IMPLANT
SYR CONTROL 10ML LL (SYRINGE) ×3 IMPLANT
TOWEL OR 17X24 6PK STRL BLUE (TOWEL DISPOSABLE) ×6 IMPLANT
TOWEL OR 17X26 10 PK STRL BLUE (TOWEL DISPOSABLE) ×6 IMPLANT
TUBE ANAEROBIC SPECIMEN COL (MISCELLANEOUS) IMPLANT
TUBE CONNECTING 12'X1/4 (SUCTIONS) ×1
TUBE CONNECTING 12X1/4 (SUCTIONS) ×2 IMPLANT
UNDERPAD 30X30 INCONTINENT (UNDERPADS AND DIAPERS) ×3 IMPLANT
WATER STERILE IRR 1000ML POUR (IV SOLUTION) IMPLANT
YANKAUER SUCT BULB TIP NO VENT (SUCTIONS) ×3 IMPLANT
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
## 2015-09-09 NOTE — Anesthesia Procedure Notes (Addendum)
Procedure Name: LMA Insertion Date/Time: 09/09/2015 2:19 PM Performed by: Trixie Deis A Pre-anesthesia Checklist: Patient identified, Emergency Drugs available, Suction available, Patient being monitored and Timeout performed Patient Re-evaluated:Patient Re-evaluated prior to inductionOxygen Delivery Method: Circle system utilized Preoxygenation: Pre-oxygenation with 100% oxygen Intubation Type: IV induction Ventilation: Mask ventilation without difficulty LMA: LMA inserted LMA Size: 5.0 Number of attempts: 1 Placement Confirmation: positive ETCO2 Tube secured with: Tape Dental Injury: Teeth and Oropharynx as per pre-operative assessment    Procedure Name: Intubation Date/Time: 09/09/2015 2:35 PM Performed by: Trixie Deis A Pre-anesthesia Checklist: Patient identified, Emergency Drugs available, Suction available, Patient being monitored and Timeout performed Patient Re-evaluated:Patient Re-evaluated prior to inductionOxygen Delivery Method: Circle system utilized Intubation Type: Combination inhalational/ intravenous induction and Cricoid Pressure applied Laryngoscope Size: Mac and 4 Grade View: Grade II Tube type: Oral Tube size: 7.5 mm Number of attempts: 1 Airway Equipment and Method: Stylet Placement Confirmation: ETT inserted through vocal cords under direct vision,  positive ETCO2 and breath sounds checked- equal and bilateral Secured at: 22 cm Tube secured with: Tape Dental Injury: Teeth and Oropharynx as per pre-operative assessment

## 2015-09-09 NOTE — Progress Notes (Signed)
PT Cancellation Note  Patient Details Name: Christian Branch MRN: [id number] DOB: [birth year]   Cancelled Treatment:    Reason Eval/Treat Not Completed: Other (comment)   To OR;   Will follow-up for PT eval postop;  Thanks,   Roney Marion, PT  Acute Rehabilitation Services Pager [phone] Office [phone]    Roney Marion [facility] 09/09/2015, 12:36 PM

## 2015-09-09 NOTE — Progress Notes (Signed)
Patient restless, keeps taking gown off, taking Wren off, playing with lines of monitor. When asked about pain, he says "pain". Snoring on and off as of this time.

## 2015-09-09 NOTE — Transfer of Care (Signed)
Immediate Anesthesia Transfer of Care Note  Patient: Christian Branch Number  Procedure(s) Performed: Procedure(s): FASCIOTOMY CLOSURE LEFT MEDIAL LOWER LEG; WOUND VAC CHANGE LEFT LATERAL LOWER LEG (Left)  Patient Location: PACU  Anesthesia Type:General  Level of Consciousness: awake, alert , oriented and patient cooperative  Airway & Oxygen Therapy: Patient Spontanous Breathing and Patient connected to nasal cannula oxygen  Post-op Assessment: Report given to RN, Post -op Vital signs reviewed and stable and Patient moving all extremities  Post vital signs: Reviewed and stable  Last Vitals:  Filed Vitals:   09/09/15 1016  BP:   Pulse:   Temp:   Resp: 21    Complications: No apparent anesthesia complications and BP and HR elevated- pt reporting pain in LLE. Pain medication given by PACU RN

## 2015-09-10 ENCOUNTER — Encounter (HOSPITAL_COMMUNITY): Payer: Self-pay | Admitting: Orthopedic Surgery

## 2015-09-10 LAB — CBC
HEMATOCRIT: 24.3 % — AB (ref 39.0–52.0)
HEMOGLOBIN: 8.1 g/dL — AB (ref 13.0–17.0)
MCH: 30.1 pg (ref 26.0–34.0)
MCHC: 33.3 g/dL (ref 30.0–36.0)
MCV: 90.3 fL (ref 78.0–100.0)
Platelets: 268 10*3/uL (ref 150–400)
RBC: 2.69 MIL/uL — ABNORMAL LOW (ref 4.22–5.81)
RDW: 12.7 % (ref 11.5–15.5)
WBC: 10.5 10*3/uL (ref 4.0–10.5)

## 2015-09-10 LAB — RAPID URINE DRUG SCREEN, HOSP PERFORMED
Amphetamines: NOT DETECTED
BARBITURATES: NOT DETECTED
BENZODIAZEPINES: POSITIVE — AB
Cocaine: NOT DETECTED
Opiates: POSITIVE — AB
Tetrahydrocannabinol: POSITIVE — AB

## 2015-09-10 MED ORDER — HYDROXYZINE HCL 25 MG PO TABS
50.0000 mg | ORAL_TABLET | Freq: Three times a day (TID) | ORAL | Status: DC | PRN
  Administered 2015-09-10 – 2015-09-29 (×11): 50 mg via ORAL
  Filled 2015-09-10 (×12): qty 2

## 2015-09-10 MED ORDER — KETOROLAC TROMETHAMINE 30 MG/ML IJ SOLN
30.0000 mg | Freq: Once | INTRAMUSCULAR | Status: AC
  Administered 2015-09-10: 30 mg via INTRAVENOUS
  Filled 2015-09-10: qty 1

## 2015-09-10 MED ORDER — OXYCODONE HCL ER 10 MG PO T12A
10.0000 mg | EXTENDED_RELEASE_TABLET | Freq: Two times a day (BID) | ORAL | Status: DC
Start: 2015-09-10 — End: 2015-09-13
  Administered 2015-09-10 – 2015-09-12 (×5): 10 mg via ORAL
  Filled 2015-09-10 (×6): qty 1
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
## 2015-09-10 NOTE — Progress Notes (Signed)
IV inflitrated I explained that the IV needed to be removed and restarted he would not let me remove it I called another nurse and she removed the IV, IV consult made Pt is also swearing at NT and myself for his pain. Telling  me this F...  Meds are not working I want more Dr Marcelino Scot notified
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
## 2015-09-10 NOTE — Progress Notes (Signed)
OT Cancellation Note  Patient Details Name: Christian Branch MRN: [id number] DOB: [birth year]   Cancelled Treatment:    Reason Eval/Treat Not Completed: Pain limiting ability to participate. Pt declining to participate in therapy at this time due to pain despite max verbal encouragement. Pt requesting to speak with MD about pain management; RN notified. Will check back for OT eval as time allows and pt is appropriate.   Binnie Kand M.S., OTR/L Pager: [phone]  09/10/2015, 12:35 PM

## 2015-09-10 NOTE — Progress Notes (Signed)
Pt is swearing at NT refuses to let us change his linens on his bed.  I tried to explain the importance of clean linens but the pt still refused swearing now at both of [place].  I asked pt to stop swearing at [place] but he continued. Charge nurse notified of refusal
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
## 2015-09-10 NOTE — Anesthesia Postprocedure Evaluation (Signed)
  Anesthesia Post-op Note  Patient: Christian Branch  Procedure(s) Performed: Procedure(s): FASCIOTOMY CLOSURE LEFT MEDIAL LOWER LEG; WOUND VAC CHANGE LEFT LATERAL LOWER LEG (Left)  Patient Location: PACU  Anesthesia Type:General  Level of Consciousness: awake and alert   Airway and Oxygen Therapy: Patient Spontanous Breathing  Post-op Pain: Controlled  Post-op Assessment: Post-op Vital signs reviewed, Patient's Cardiovascular Status Stable and Respiratory Function Stable  Post-op Vital Signs: Reviewed  Filed Vitals:   09/10/15 0526  BP: 143/81  Pulse: 108  Temp: 37.6 C  Resp: 18    Complications: No apparent anesthesia complications

## 2015-09-10 NOTE — H&P (Signed)
Patient refusing to talk after being told valuables policy .  Refusing to finish history
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
## 2015-09-11 ENCOUNTER — Inpatient Hospital Stay (HOSPITAL_COMMUNITY): Payer: Self-pay

## 2015-09-11 DIAGNOSIS — R000 Tachycardia, unspecified: Secondary | ICD-10-CM

## 2015-09-11 DIAGNOSIS — R339 Retention of urine, unspecified: Secondary | ICD-10-CM | POA: Diagnosis not present

## 2015-09-11 LAB — CBC
HCT: 23.7 % — ABNORMAL LOW (ref 39.0–52.0)
Hemoglobin: 7.8 g/dL — ABNORMAL LOW (ref 13.0–17.0)
MCH: 29.9 pg (ref 26.0–34.0)
MCHC: 32.9 g/dL (ref 30.0–36.0)
MCV: 90.8 fL (ref 78.0–100.0)
PLATELETS: 400 10*3/uL (ref 150–400)
RBC: 2.61 MIL/uL — AB (ref 4.22–5.81)
RDW: 12.9 % (ref 11.5–15.5)
WBC: 10.6 10*3/uL — AB (ref 4.0–10.5)

## 2015-09-11 MED ORDER — TAMSULOSIN HCL 0.4 MG PO CAPS
0.4000 mg | ORAL_CAPSULE | Freq: Every day | ORAL | Status: DC
  Administered 2015-09-11 – 2015-09-13 (×3): 0.4 mg via ORAL
  Filled 2015-09-11 (×3): qty 1

## 2015-09-11 MED ORDER — DOCUSATE SODIUM 100 MG PO CAPS
200.0000 mg | ORAL_CAPSULE | Freq: Two times a day (BID) | ORAL | Status: DC
Start: 2015-09-11 — End: 2015-10-03
  Administered 2015-09-11 – 2015-10-02 (×26): 200 mg via ORAL
  Filled 2015-09-11 (×37): qty 2

## 2015-09-11 MED ORDER — BETHANECHOL CHLORIDE 25 MG PO TABS
25.0000 mg | ORAL_TABLET | Freq: Four times a day (QID) | ORAL | Status: DC
  Administered 2015-09-11 – 2015-09-14 (×11): 25 mg via ORAL
  Filled 2015-09-11 (×12): qty 1
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
## 2015-09-11 NOTE — Progress Notes (Signed)
OT Cancellation Note  Patient Details Name: Christian Branch MRN: [id number] DOB: 25-Apr-1976   Cancelled Treatment:    Reason Eval/Treat Not Completed: Patient at procedure or test/ unavailable. Will check back for OT eval as time allows and pt is appropriate.   Binnie Kand M.S., OTR/L Pager: [phone]  09/11/2015, 2:47 PM

## 2015-09-11 NOTE — Progress Notes (Signed)
*  PRELIMINARY RESULTS* Vascular Ultrasound Lower extremity venous duplex has been completed.  Preliminary findings: No evidence of DVT in right lower extremity and left thigh.  Patient refused for dressings/ bandages to be removed from left leg. Cannot exclude DVT in this area.   Landry Mellow, RDMS, RVT  09/11/2015, 2:17 PM

## 2015-09-11 NOTE — Progress Notes (Signed)
PT Cancellation Note  Patient Details Name: Christian Branch MRN: [id number] DOB: 11-19-1975   Cancelled Treatment:    Reason Eval/Treat Not Completed: Patient at procedure or test/unavailable. Will follow as time allows.    Cassell Clement, PT, CSCS Pager [phone] Office [phone]  09/11/2015, 2:26 PM

## 2015-09-12 ENCOUNTER — Inpatient Hospital Stay (HOSPITAL_COMMUNITY): Payer: MEDICAID | Admitting: Certified Registered Nurse Anesthetist

## 2015-09-12 ENCOUNTER — Encounter (HOSPITAL_COMMUNITY): Admission: EM | Disposition: A | Discharge status: Home / Self Care | Payer: Self-pay | Source: Home / Self Care

## 2015-09-12 ENCOUNTER — Inpatient Hospital Stay (HOSPITAL_COMMUNITY): Payer: Self-pay | Admitting: Certified Registered Nurse Anesthetist

## 2015-09-12 HISTORY — PX: COMPLEX WOUND CLOSURE: SHX6446

## 2015-09-12 SURGERY — COMPLEX CLOSURE, WOUND
Anesthesia: General | Site: Leg Lower | Laterality: Left

## 2015-09-12 MED ORDER — KETAMINE HCL 100 MG/ML IJ SOLN
INTRAMUSCULAR | Status: AC
  Filled 2015-09-12: qty 1

## 2015-09-12 MED ORDER — EPHEDRINE SULFATE 50 MG/ML IJ SOLN
INTRAMUSCULAR | Status: AC
  Filled 2015-09-12: qty 1

## 2015-09-12 MED ORDER — GLYCOPYRROLATE 0.2 MG/ML IJ SOLN
INTRAMUSCULAR | Status: DC | PRN
  Administered 2015-09-12: 0.4 mg via INTRAVENOUS

## 2015-09-12 MED ORDER — SODIUM CHLORIDE 0.9 % IR SOLN
Status: DC | PRN
  Administered 2015-09-12: 3000 mL

## 2015-09-12 MED ORDER — PROPOFOL 10 MG/ML IV BOLUS
INTRAVENOUS | Status: DC | PRN
  Administered 2015-09-12: 200 mg via INTRAVENOUS

## 2015-09-12 MED ORDER — ROCURONIUM BROMIDE 50 MG/5ML IV SOLN
INTRAVENOUS | Status: AC
  Filled 2015-09-12: qty 1

## 2015-09-12 MED ORDER — PROPOFOL 10 MG/ML IV BOLUS
INTRAVENOUS | Status: AC
  Filled 2015-09-12: qty 40

## 2015-09-12 MED ORDER — HYDROMORPHONE HCL 1 MG/ML IJ SOLN
0.5000 mg | INTRAMUSCULAR | Status: DC | PRN
  Administered 2015-09-12 (×2): 0.5 mg via INTRAVENOUS

## 2015-09-12 MED ORDER — OXYCODONE HCL 5 MG PO TABS
ORAL_TABLET | ORAL | Status: AC
  Filled 2015-09-12: qty 1

## 2015-09-12 MED ORDER — HYDROMORPHONE HCL 1 MG/ML IJ SOLN
INTRAMUSCULAR | Status: AC
  Filled 2015-09-12: qty 2

## 2015-09-12 MED ORDER — OXYCODONE HCL 5 MG PO TABS
5.0000 mg | ORAL_TABLET | Freq: Once | ORAL | Status: AC | PRN
  Administered 2015-09-12: 5 mg via ORAL

## 2015-09-12 MED ORDER — KETAMINE HCL 10 MG/ML IJ SOLN
INTRAMUSCULAR | Status: DC | PRN
  Administered 2015-09-12: 20 mg via INTRAVENOUS
  Administered 2015-09-12: 50 mg via INTRAVENOUS
  Administered 2015-09-12: 20 mg via INTRAVENOUS

## 2015-09-12 MED ORDER — OXYCODONE HCL 5 MG/5ML PO SOLN: 5.0000 mg | Freq: Once | ORAL | Status: AC | PRN

## 2015-09-12 MED ORDER — ARTIFICIAL TEARS OP OINT
TOPICAL_OINTMENT | OPHTHALMIC | Status: AC
  Filled 2015-09-12: qty 3.5

## 2015-09-12 MED ORDER — MIDAZOLAM HCL 5 MG/5ML IJ SOLN
INTRAMUSCULAR | Status: DC | PRN
  Administered 2015-09-12: 2 mg via INTRAVENOUS

## 2015-09-12 MED ORDER — HYDROMORPHONE HCL 1 MG/ML IJ SOLN
0.2500 mg | INTRAMUSCULAR | Status: DC | PRN
  Administered 2015-09-12: 0.5 mg via INTRAVENOUS
  Administered 2015-09-12: 1 mg via INTRAVENOUS
  Administered 2015-09-12: 0.5 mg via INTRAVENOUS

## 2015-09-12 MED ORDER — NEOSTIGMINE METHYLSULFATE 10 MG/10ML IV SOLN
INTRAVENOUS | Status: DC | PRN
Start: 2015-09-12 — End: 2015-09-12
  Administered 2015-09-12: 3 mg via INTRAVENOUS

## 2015-09-12 MED ORDER — PHENYLEPHRINE HCL 10 MG/ML IJ SOLN
INTRAMUSCULAR | Status: DC | PRN
  Administered 2015-09-12: 120 ug via INTRAVENOUS
  Administered 2015-09-12: 80 ug via INTRAVENOUS

## 2015-09-12 MED ORDER — FENTANYL CITRATE (PF) 250 MCG/5ML IJ SOLN
INTRAMUSCULAR | Status: AC
  Filled 2015-09-12: qty 5

## 2015-09-12 MED ORDER — HYDROMORPHONE HCL 1 MG/ML IJ SOLN
INTRAMUSCULAR | Status: AC
  Filled 2015-09-12: qty 1

## 2015-09-12 MED ORDER — ONDANSETRON HCL 4 MG/2ML IJ SOLN
INTRAMUSCULAR | Status: AC
  Filled 2015-09-12: qty 2

## 2015-09-12 MED ORDER — LACTATED RINGERS IV SOLN
INTRAVENOUS | Status: DC | PRN
  Administered 2015-09-12 (×2): via INTRAVENOUS

## 2015-09-12 MED ORDER — NEOSTIGMINE METHYLSULFATE 10 MG/10ML IV SOLN
INTRAVENOUS | Status: AC
Start: 2015-09-12 — End: 2015-09-12
  Filled 2015-09-12: qty 1

## 2015-09-12 MED ORDER — ARTIFICIAL TEARS OP OINT
TOPICAL_OINTMENT | OPHTHALMIC | Status: DC | PRN
  Administered 2015-09-12: 1 via OPHTHALMIC

## 2015-09-12 MED ORDER — 0.9 % SODIUM CHLORIDE (POUR BTL) OPTIME
TOPICAL | Status: DC | PRN
  Administered 2015-09-12: 1000 mL

## 2015-09-12 MED ORDER — ONDANSETRON HCL 4 MG/2ML IJ SOLN
INTRAMUSCULAR | Status: DC | PRN
  Administered 2015-09-12: 4 mg via INTRAVENOUS

## 2015-09-12 MED ORDER — SODIUM CHLORIDE 0.9 % IJ SOLN
INTRAMUSCULAR | Status: AC
  Filled 2015-09-12: qty 10

## 2015-09-12 MED ORDER — LIDOCAINE HCL (CARDIAC) 20 MG/ML IV SOLN
INTRAVENOUS | Status: AC
  Filled 2015-09-12: qty 5

## 2015-09-12 MED ORDER — LIDOCAINE HCL (CARDIAC) 20 MG/ML IV SOLN
INTRAVENOUS | Status: DC | PRN
  Administered 2015-09-12: 60 mg via INTRAVENOUS

## 2015-09-12 MED ORDER — PROMETHAZINE HCL 25 MG/ML IJ SOLN: 6.2500 mg | INTRAMUSCULAR | Status: DC | PRN

## 2015-09-12 MED ORDER — MIDAZOLAM HCL 2 MG/2ML IJ SOLN
INTRAMUSCULAR | Status: AC
Start: 2015-09-12 — End: 2015-09-12
  Filled 2015-09-12: qty 2

## 2015-09-12 MED ORDER — SUCCINYLCHOLINE CHLORIDE 20 MG/ML IJ SOLN
INTRAMUSCULAR | Status: AC
  Filled 2015-09-12: qty 1

## 2015-09-12 MED ORDER — FENTANYL CITRATE (PF) 100 MCG/2ML IJ SOLN
INTRAMUSCULAR | Status: DC | PRN
  Administered 2015-09-12 (×5): 50 ug via INTRAVENOUS

## 2015-09-12 MED ORDER — GLYCOPYRROLATE 0.2 MG/ML IJ SOLN
INTRAMUSCULAR | Status: AC
  Filled 2015-09-12: qty 3

## 2015-09-12 SURGICAL SUPPLY — 67 items
BANDAGE ELASTIC 3 VELCRO ST LF (GAUZE/BANDAGES/DRESSINGS) ×3 IMPLANT
BANDAGE ELASTIC 4 VELCRO ST LF (GAUZE/BANDAGES/DRESSINGS) ×3 IMPLANT
BANDAGE ELASTIC 6 VELCRO ST LF (GAUZE/BANDAGES/DRESSINGS) ×3 IMPLANT
BLADE SURG ROTATE 9660 (MISCELLANEOUS) IMPLANT
BNDG COHESIVE 4X5 TAN STRL (GAUZE/BANDAGES/DRESSINGS) IMPLANT
BNDG GAUZE ELAST 4 BULKY (GAUZE/BANDAGES/DRESSINGS) ×6 IMPLANT
BRUSH SCRUB DISP (MISCELLANEOUS) ×6 IMPLANT
CANISTER SUCTION 2500CC (MISCELLANEOUS) IMPLANT
CANISTER WOUND CARE 500ML ATS (WOUND CARE) ×3 IMPLANT
COTTONBALL LRG STERILE PKG (GAUZE/BANDAGES/DRESSINGS) ×6 IMPLANT
COVER SURGICAL LIGHT HANDLE (MISCELLANEOUS) ×6 IMPLANT
DERMACARRIERS GRAFT 1 TO 1.5 (DISPOSABLE)
DRAPE C-ARMOR (DRAPES) ×3 IMPLANT
DRAPE EXTREMITY T 121X128X90 (DRAPE) IMPLANT
DRAPE PROXIMA HALF (DRAPES) ×6 IMPLANT
DRSG ADAPTIC 3X8 NADH LF (GAUZE/BANDAGES/DRESSINGS) IMPLANT
DRSG MEPITEL 4X7.2 (GAUZE/BANDAGES/DRESSINGS) ×3 IMPLANT
DRSG PAD ABDOMINAL 8X10 ST (GAUZE/BANDAGES/DRESSINGS) ×6 IMPLANT
DRSG VAC ATS SM SENSATRAC (GAUZE/BANDAGES/DRESSINGS) ×3 IMPLANT
ELECT REM PT RETURN 9FT ADLT (ELECTROSURGICAL)
ELECTRODE REM PT RTRN 9FT ADLT (ELECTROSURGICAL) IMPLANT
GAUZE SPONGE 4X4 12PLY STRL (GAUZE/BANDAGES/DRESSINGS) ×6 IMPLANT
GAUZE SPONGE 4X4 16PLY XRAY LF (GAUZE/BANDAGES/DRESSINGS) ×3 IMPLANT
GAUZE XEROFORM 5X9 LF (GAUZE/BANDAGES/DRESSINGS) IMPLANT
GLOVE BIO SURGEON STRL SZ7.5 (GLOVE) ×3 IMPLANT
GLOVE BIO SURGEON STRL SZ8 (GLOVE) ×3 IMPLANT
GLOVE BIOGEL PI IND STRL 7.5 (GLOVE) ×1 IMPLANT
GLOVE BIOGEL PI IND STRL 8 (GLOVE) ×1 IMPLANT
GLOVE BIOGEL PI INDICATOR 7.5 (GLOVE) ×2
GLOVE BIOGEL PI INDICATOR 8 (GLOVE) ×2
GOWN STRL REUS W/ TWL LRG LVL3 (GOWN DISPOSABLE) ×2 IMPLANT
GOWN STRL REUS W/ TWL XL LVL3 (GOWN DISPOSABLE) ×1 IMPLANT
GOWN STRL REUS W/TWL 2XL LVL3 (GOWN DISPOSABLE) IMPLANT
GOWN STRL REUS W/TWL LRG LVL3 (GOWN DISPOSABLE) ×4
GOWN STRL REUS W/TWL XL LVL3 (GOWN DISPOSABLE) ×2
GRAFT DERMACARRIERS 1 TO 1.5 (DISPOSABLE) IMPLANT
HANDPIECE INTERPULSE COAX TIP (DISPOSABLE)
KIT BASIN OR (CUSTOM PROCEDURE TRAY) ×3 IMPLANT
KIT ROOM TURNOVER OR (KITS) ×3 IMPLANT
MANIFOLD NEPTUNE II (INSTRUMENTS) IMPLANT
NS IRRIG 1000ML POUR BTL (IV SOLUTION) ×3 IMPLANT
PACK GENERAL/GYN (CUSTOM PROCEDURE TRAY) ×3 IMPLANT
PAD ABD 8X10 STRL (GAUZE/BANDAGES/DRESSINGS) ×6 IMPLANT
PAD ARMBOARD 7.5X6 YLW CONV (MISCELLANEOUS) ×6 IMPLANT
PAD CAST 4YDX4 CTTN HI CHSV (CAST SUPPLIES) ×1 IMPLANT
PADDING CAST COTTON 4X4 STRL (CAST SUPPLIES) ×2
PADDING CAST COTTON 6X4 STRL (CAST SUPPLIES) ×3 IMPLANT
PENCIL BUTTON HOLSTER BLD 10FT (ELECTRODE) ×3 IMPLANT
SET HNDPC FAN SPRY TIP SCT (DISPOSABLE) IMPLANT
SPONGE GAUZE 4X4 12PLY STER LF (GAUZE/BANDAGES/DRESSINGS) ×6 IMPLANT
SPONGE LAP 18X18 X RAY DECT (DISPOSABLE) ×9 IMPLANT
SPONGE SCRUB IODOPHOR (GAUZE/BANDAGES/DRESSINGS) ×3 IMPLANT
STAPLER VISISTAT (STAPLE) ×3 IMPLANT
STAPLER VISISTAT 35W (STAPLE) ×3 IMPLANT
STOCKINETTE IMPERVIOUS LG (DRAPES) IMPLANT
SUT ETHILON 1 TP 1 60 (SUTURE) ×6 IMPLANT
SUT ETHILON 2 0 PSLX (SUTURE) ×6 IMPLANT
SUT ETHILON 3 0 FSL (SUTURE) ×3 IMPLANT
SUT VIC AB 2-0 CT1 27 (SUTURE) ×2
SUT VIC AB 2-0 CT1 TAPERPNT 27 (SUTURE) ×1 IMPLANT
TOWEL OR 17X24 6PK STRL BLUE (TOWEL DISPOSABLE) ×3 IMPLANT
TOWEL OR 17X26 10 PK STRL BLUE (TOWEL DISPOSABLE) ×6 IMPLANT
TUBE CONNECTING 12'X1/4 (SUCTIONS)
TUBE CONNECTING 12X1/4 (SUCTIONS) IMPLANT
UNDERPAD 30X30 INCONTINENT (UNDERPADS AND DIAPERS) ×3 IMPLANT
WATER STERILE IRR 1000ML POUR (IV SOLUTION) ×3 IMPLANT
YANKAUER SUCT BULB TIP NO VENT (SUCTIONS) IMPLANT

## 2015-09-12 NOTE — Progress Notes (Signed)
PT Cancellation Note  Patient Details Name: Taivion Alvelo MRN: [id number] DOB: [birth year]   Cancelled Treatment:    Reason Eval/Treat Not Completed: Patient declined, no reason specified.    Cassell Clement, PT, CSCS Pager [phone] Office [phone]  09/12/2015, 3:11 PM

## 2015-09-12 NOTE — Anesthesia Preprocedure Evaluation (Addendum)
Anesthesia Evaluation  Patient identified by MRN, date of birth, ID band Patient awake    Reviewed: Allergy & Precautions, NPO status , Patient's Chart, lab work & pertinent test results  Airway Mallampati: II  TM Distance: >3 FB Neck ROM: Full    Dental no notable dental hx.    Pulmonary neg pulmonary ROS,    Pulmonary exam normal breath sounds clear to auscultation       Cardiovascular negative cardio ROS Normal cardiovascular exam Rhythm:Regular Rate:Normal     Neuro/Psych negative neurological ROS  negative psych ROS   GI/Hepatic negative GI ROS, Neg liver ROS,   Endo/Other  negative endocrine ROS  Renal/GU negative Renal ROS     Musculoskeletal negative musculoskeletal ROS (+)   Abdominal   Peds  Hematology  (+) anemia ,   Anesthesia Other Findings   Reproductive/Obstetrics negative OB ROS                            Anesthesia Physical  Anesthesia Plan  ASA: II  Anesthesia Plan: General   Post-op Pain Management:    Induction: Intravenous  Airway Management Planned: Oral ETT  Additional Equipment:   Intra-op Plan:   Post-operative Plan: Extubation in OR  Informed Consent: I have reviewed the patients History and Physical, chart, labs and discussed the procedure including the risks, benefits and alternatives for the proposed anesthesia with the patient or authorized representative who has indicated his/her understanding and acceptance.   Dental advisory given  Plan Discussed with: CRNA  Anesthesia Plan Comments:        Anesthesia Quick Evaluation

## 2015-09-12 NOTE — Brief Op Note (Signed)
09/07/2015 - 09/12/2015  9:19 AM  PATIENT:  Christian Branch  39 y.o. male  PRE-OPERATIVE DIAGNOSIS:  FASCIOTOMY LEFT LEG   POST-OPERATIVE DIAGNOSIS:  FASCIOTOMY LEFT LEG   PROCEDURE:  Procedure(s): COMPLEX WOUND CLOSURE (Left) 17.5cm, with retention sutures  SURGEON:  Surgeon(s) and Role:    * Altamese Montezuma, MD - Primary  PHYSICIAN ASSISTANT:  1. [name], PA-C 2. PA student  ANESTHESIA:   general  I/O:  Total I/O In: 1000 [I.V.:1000] Out: -   SPECIMEN:  No Specimen  TOURNIQUET:  * No tourniquets in log *  DICTATION: .Other Dictation: Dictation Branch [phone]

## 2015-09-12 NOTE — Progress Notes (Signed)
Taken over patient care at 1300 from previous RN. Pt very anxious and  using profanity at this time.

## 2015-09-12 NOTE — Transfer of Care (Signed)
Immediate Anesthesia Transfer of Care Note  Patient: Christian Branch  Procedure(s) Performed: Procedure(s): COMPLEX WOUND CLOSURE (Left)  Patient Location: PACU  Anesthesia Type:General  Level of Consciousness: awake, patient cooperative and lethargic  Airway & Oxygen Therapy: Patient Spontanous Breathing and Patient connected to face mask oxygen  Post-op Assessment: Report given to RN and Post -op Vital signs reviewed and stable  Post vital signs: Reviewed and stable  Last Vitals:  Filed Vitals:   09/12/15 1000  BP: 203/95  Pulse: 125  Temp: 36.6 C  Resp: 23    Complications: No apparent anesthesia complications

## 2015-09-12 NOTE — Anesthesia Procedure Notes (Signed)
Procedure Name: Intubation Date/Time: 09/12/2015 8:06 AM Performed by: Willeen Cass P Pre-anesthesia Checklist: Patient identified, Emergency Drugs available, Suction available, Patient being monitored and Timeout performed Patient Re-evaluated:Patient Re-evaluated prior to inductionOxygen Delivery Method: Circle system utilized Preoxygenation: Pre-oxygenation with 100% oxygen Intubation Type: IV induction Ventilation: Mask ventilation without difficulty Laryngoscope Size: Mac and 4 Grade View: Grade I Tube type: Oral Tube size: 7.5 mm Number of attempts: 1 Airway Equipment and Method: Stylet Placement Confirmation: ETT inserted through vocal cords under direct vision,  positive ETCO2 and breath sounds checked- equal and bilateral Secured at: 22 cm Tube secured with: Tape Dental Injury: Teeth and Oropharynx as per pre-operative assessment

## 2015-09-12 NOTE — Progress Notes (Signed)
C/o dry lips  R&LLE Dressing intact, clean, dry   Edema/ swelling controlled   Sens: DPN, SPN, TN intact   Motor: EHL, FHL, and lessor toe ext and flex all intact grossly   Brisk cap refill, warm to touch   I discussed with the patient the risks and benefits of surgery for his left leg open fasciotomy, including the possibility of graft failure, infection, nerve injury, vessel injury, wound breakdown, arthritis, scarring, DVT/ PE, loss of motion, and need for further surgery among others.  We also specifically discussed the need to stage surgery because of the elevated risk of soft tissue breakdown that could lead to amputation.  He understood these risks and wished to proceed.  Altamese [place], MD [facility] [phone] (p)
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
## 2015-09-12 NOTE — Progress Notes (Signed)
PT Cancellation Note  Patient Details Name: Christian Branch MRN: [id number] DOB: 1975-12-04   Cancelled Treatment:    Reason Eval/Treat Not Completed: Patient at procedure or test/unavailable. Will follow as time allows.    Cassell Clement, PT, CSCS Pager [phone] Office [phone]  09/12/2015, 10:54 AM

## 2015-09-12 NOTE — Anesthesia Postprocedure Evaluation (Signed)
  Anesthesia Post-op Note  Patient: Christian Branch  Procedure(s) Performed: Procedure(s): COMPLEX WOUND CLOSURE (Left)  Patient Location: PACU  Anesthesia Type:General  Level of Consciousness: awake and alert   Airway and Oxygen Therapy: Patient Spontanous Breathing  Post-op Pain: mild  Post-op Assessment: Post-op Vital signs reviewed LLE Motor Response: Responds to commands LLE Sensation: Full sensation RLE Motor Response: Purposeful movement, Responds to commands        Post-op Vital Signs: Reviewed  Last Vitals:  Filed Vitals:   09/12/15 1151  BP: 207/93  Pulse: 105  Temp: 37.4 C  Resp: 18    Complications: No apparent anesthesia complications
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
## 2015-09-12 NOTE — Progress Notes (Signed)
Report given to robin rn as caregiver 

## 2015-09-13 ENCOUNTER — Encounter (HOSPITAL_COMMUNITY): Payer: Self-pay | Admitting: Orthopedic Surgery

## 2015-09-13 MED ORDER — HYDROMORPHONE HCL 1 MG/ML IJ SOLN
0.5000 mg | INTRAMUSCULAR | Status: DC | PRN
  Administered 2015-09-13 – 2015-09-21 (×11): 0.5 mg via INTRAVENOUS
  Filled 2015-09-13 (×12): qty 1

## 2015-09-13 MED ORDER — MAGNESIUM CITRATE PO SOLN
1.0000 | Freq: Once | ORAL | Status: AC
  Administered 2015-09-13: 1 via ORAL
  Filled 2015-09-13: qty 296

## 2015-09-13 MED ORDER — OXYCODONE HCL ER 10 MG PO T12A
20.0000 mg | EXTENDED_RELEASE_TABLET | Freq: Two times a day (BID) | ORAL | Status: DC
  Administered 2015-09-13 – 2015-09-26 (×27): 20 mg via ORAL
  Filled 2015-09-13 (×27): qty 2

## 2015-09-13 MED ORDER — METHOCARBAMOL 500 MG PO TABS
1000.0000 mg | ORAL_TABLET | Freq: Four times a day (QID) | ORAL | Status: DC | PRN
  Administered 2015-09-13 – 2015-10-02 (×48): 1000 mg via ORAL
  Filled 2015-09-13 (×51): qty 2

## 2015-09-13 MED ORDER — WHITE PETROLATUM GEL
Status: AC
  Administered 2015-09-13: 09:00:00
  Filled 2015-09-13: qty 1
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
## 2015-09-13 NOTE — Progress Notes (Signed)
Per ortho PA, keith paul, d/c foley after PT this morning.  Nsg to continue to monitor for status changes.
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
## 2015-09-13 NOTE — Clinical Social Work Note (Signed)
Clinical Social Worker continuing to follow patient for support and discharge planning needs.  Patient states now, that he has no where to stay and no one he could possible stay with.  PT recommending SNF at discharge.  CSW has initiated mass search and will follow up with facilities directly.  Patient aware that facility may not be local and remains in agreement at this time.  CSW remains available for support and to facilitate patient discharge needs once medically stable.  Christian Branch, [place]
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
## 2015-09-13 NOTE — Progress Notes (Deleted)
OT Cancellation Note  Patient Details Name: Christian Branch MRN: [id number] DOB: [birth year]   Cancelled Treatment:    Reason Eval/Treat Not Completed: Other (comment) Attempted again after letting pt rest the 1 hour that she requested. Pt declined again stating she "needed her rest". Will follow up. [place], OTR/L  [id number] 09/13/2015 09/13/2015, 11:53 AM
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
## 2015-09-14 LAB — CBC
HCT: 24.9 % — ABNORMAL LOW (ref 39.0–52.0)
HEMOGLOBIN: 8.3 g/dL — AB (ref 13.0–17.0)
MCH: 29.9 pg (ref 26.0–34.0)
MCHC: 33.3 g/dL (ref 30.0–36.0)
MCV: 89.6 fL (ref 78.0–100.0)
Platelets: 677 10*3/uL — ABNORMAL HIGH (ref 150–400)
RBC: 2.78 MIL/uL — ABNORMAL LOW (ref 4.22–5.81)
RDW: 12.6 % (ref 11.5–15.5)
WBC: 12.6 10*3/uL — ABNORMAL HIGH (ref 4.0–10.5)

## 2015-09-14 MED ORDER — BETHANECHOL CHLORIDE 10 MG PO TABS
20.0000 mg | ORAL_TABLET | Freq: Three times a day (TID) | ORAL | Status: DC
  Administered 2015-09-14 – 2015-09-15 (×3): 20 mg via ORAL
  Filled 2015-09-14 (×5): qty 2
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
## 2015-09-15 MED ORDER — BISACODYL 10 MG RE SUPP
10.0000 mg | Freq: Every day | RECTAL | Status: DC
  Filled 2015-09-15: qty 1

## 2015-09-15 MED ORDER — BETHANECHOL CHLORIDE 10 MG PO TABS
10.0000 mg | ORAL_TABLET | Freq: Four times a day (QID) | ORAL | Status: DC
  Administered 2015-09-15 – 2015-09-16 (×4): 10 mg via ORAL
  Filled 2015-09-15 (×7): qty 1

## 2015-09-15 MED ORDER — MAGNESIUM CITRATE PO SOLN
1.0000 | ORAL | Status: DC | PRN
  Administered 2015-09-17 – 2015-09-30 (×2): 1 via ORAL
  Filled 2015-09-15 (×2): qty 296
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
## 2015-09-15 NOTE — Progress Notes (Signed)
Orthopaedic Trauma Service (OTS)   Subjective: Patient reports pain as mild.  Tingling left UEX intermittently. Friendly and agreeable today.  Eager to mobilize to chair.  Objective: Temp:  [98.1 F (36.7 C)-99.2 F (37.3 C)] 98.7 F (37.1 C) (11/20 0422) Pulse Rate:  [79-98] 98 (11/20 0422) Resp:  [18] 18 (11/20 0422) BP: (135-180)/(69-100) 135/79 mmHg (11/20 0422) SpO2:  [97 %-100 %] 98 % (11/20 0422)  Physical Exam  LLE Dressing intact, clean, dry  Edema/ swelling controlled  Sens: DPN, SPN, TN intact  Motor: EHL, FHL, and lessor toe ext and flex all intact grossly  Brisk cap refill, warm to touch LUEx  Mot   Ax/ R/ PIN/ M/ AIN/ U intact  Rad 2+  FROM   Assessment/Plan: 3 Days Post-Op Procedure(s) (LRB): COMPLEX WOUND CLOSURE (Left)  Dressing change tomorrow prior to d/c Delayed ORIF in 2 wks NWB Lovenox  Altamese Amanda Park, MD [facility] [phone] (p)
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
## 2015-09-16 MED ORDER — BISACODYL 5 MG PO TBEC
10.0000 mg | DELAYED_RELEASE_TABLET | Freq: Once | ORAL | Status: AC
  Administered 2015-09-16: 10 mg via ORAL
  Filled 2015-09-16: qty 2

## 2015-09-16 MED ORDER — BETHANECHOL CHLORIDE 10 MG PO TABS
10.0000 mg | ORAL_TABLET | Freq: Three times a day (TID) | ORAL | Status: DC
  Administered 2015-09-16 – 2015-09-17 (×3): 10 mg via ORAL
  Filled 2015-09-16 (×5): qty 1

## 2015-09-16 MED ORDER — POLYETHYLENE GLYCOL 3350 17 G PO PACK
17.0000 g | PACK | Freq: Two times a day (BID) | ORAL | Status: DC
  Administered 2015-09-16 – 2015-10-01 (×8): 17 g via ORAL
  Filled 2015-09-16 (×28): qty 1
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
## 2015-09-16 NOTE — Clinical Social Work Note (Signed)
Clinical Social Worker continuing to follow patient and family for support and discharge planning needs.  Patient continues to improve with therapies, however still requiring SNF placement.  CSW has contacted Surveyor, quantity to address the potential for difficult to place.  CSW to continue to follow for support and facilitate patient discharge needs once bed available.  Christian Branch, [place]
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
## 2015-09-17 LAB — CBC
HCT: 26.8 % — ABNORMAL LOW (ref 39.0–52.0)
HEMOGLOBIN: 8.6 g/dL — AB (ref 13.0–17.0)
MCH: 29.5 pg (ref 26.0–34.0)
MCHC: 32.1 g/dL (ref 30.0–36.0)
MCV: 91.8 fL (ref 78.0–100.0)
Platelets: 740 10*3/uL — ABNORMAL HIGH (ref 150–400)
RBC: 2.92 MIL/uL — AB (ref 4.22–5.81)
RDW: 13.1 % (ref 11.5–15.5)
WBC: 10 10*3/uL (ref 4.0–10.5)

## 2015-09-17 LAB — URINE DRUGS OF ABUSE SCREEN W ALC, ROUTINE (REF LAB)
AMPHETAMINES, URINE: NEGATIVE ng/mL
BARBITURATE, UR: NEGATIVE ng/mL
Cocaine (Metab.): NEGATIVE ng/mL
ETHANOL U, QUAN: NEGATIVE %
METHADONE SCREEN, URINE: NEGATIVE ng/mL
PROPOXYPHENE, URINE: NEGATIVE ng/mL
Phencyclidine, Ur: NEGATIVE ng/mL

## 2015-09-17 LAB — OPIATES CONFIRMATION, URINE: CODEINE: NEGATIVE

## 2015-09-17 LAB — PANEL 799049
CANNABINOID GC/MS UR: POSITIVE — AB
CARBOXY THC GC/MS CONF: 507 ng/mL

## 2015-09-17 LAB — DRUG PROFILE 799031: BENZODIAZEPINES: NEGATIVE

## 2015-09-17 MED ORDER — BETHANECHOL CHLORIDE 5 MG PO TABS
5.0000 mg | ORAL_TABLET | Freq: Three times a day (TID) | ORAL | Status: DC
  Administered 2015-09-17 – 2015-09-24 (×21): 5 mg via ORAL
  Filled 2015-09-17 (×26): qty 1
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
## 2015-09-17 NOTE — Progress Notes (Signed)
Pt refused CBC lab draw at this time. Rescheduled order for later time. Nursing will continue to monitor.

## 2015-09-17 NOTE — Care Management Note (Signed)
Case Management Note  Patient Details  Name: Christian Branch MRN: [id number] Date of Birth: 1976-03-26  Subjective/Objective:  PT/OT recommending SNF at dc.    Pt making slow, steady progress with therapies, though still needs SNF bed.  He is uninsured, and will be difficult to place.                Action/Plan: CSW following to facilitate dc to SNF when bed available.  Will follow progress.    Expected Discharge Date:                  Expected Discharge Plan:  Skilled Nursing Facility  In-House Referral:  Clinical Social Work  Discharge planning Services  CM Consult  Post Acute Care Choice:    Choice offered to:     DME Arranged:    DME Agency:     HH Arranged:    [place] Agency:     Status of Service:  In process, will continue to follow  Medicare Important Message Given:    Date Medicare IM Given:    Medicare IM give by:    Date Additional Medicare IM Given:    Additional Medicare Important Message give by:     If discussed at [place] of Stay Meetings, dates discussed:    Additional Comments:  Reinaldo Raddle, RN, BSN  Trauma/Neuro ICU Case Manager [phone]

## 2015-09-17 NOTE — Progress Notes (Signed)
OT Cancellation Note  Patient Details Name: Christian Branch MRN: [id number] DOB: May 24, 1976   Cancelled Treatment:    Reason Eval/Treat Not Completed: Patient declined, no reason specified  [name] 09/17/2015, 1:35 PM
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
## 2015-09-18 ENCOUNTER — Inpatient Hospital Stay (HOSPITAL_COMMUNITY): Payer: MEDICAID
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
## 2015-09-18 NOTE — Progress Notes (Addendum)
Pt refused to have the abdominal xray performed. Pt stated " my stomach does not hurt, I will try to use the bathroom". NP was paged and is aware. Pt also refused to have pin care performed today. Will continue to monitor
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
## 2015-09-19 ENCOUNTER — Inpatient Hospital Stay (HOSPITAL_COMMUNITY): Payer: MEDICAID
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
## 2015-09-19 NOTE — Progress Notes (Signed)
Pt refused abdominal xray this morning. Educated pt on why MD ordered test and asked if it would OK for them to come back later in the day. Pt stated that was "fine." Verbalized this to xray tech. Will pass on to oncoming nursing staff. Nursing will continue to monitor.
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
## 2015-09-20 NOTE — Progress Notes (Signed)
OT Cancellation Note  Patient Details Name: Cordarrius Corpe MRN: [id number] DOB: Aug 24, 1976   Cancelled Treatment:    Reason Eval/Treat Not Completed: Patient declined, no reason specified. Pt reports that he already got up and walked earlier today. Explained to pt the difference between PT and OT and encouraged pt to participate in OT this PM. Pt declined stating that he just wanted to watch TV right now. Will check back as time allows.  Binnie Kand M.S., OTR/L Pager: [phone]  09/20/2015, 2:13 PM

## 2015-09-20 NOTE — Clinical Social Work Note (Signed)
Clinical Social Worker continuing to follow patient and family for support and discharge planning needs.  Patient awaiting bed availability at a difficult to place facility - currently no bed space available.  CSW to follow up with supervisor to discuss additional bed offer options.  CSW remains available for support as needed.  Christian Branch, [place]
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
## 2015-09-22 NOTE — Progress Notes (Signed)
No change in current d/c plan. Seeking difficult to place bed for patient.  CSW will follow up with SW leadership on Monday to determine status of bed availabilities.  Lorie Phenix. Eyana Stolze, LCSW [phone](weekend coverage)
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
## 2015-09-23 NOTE — Progress Notes (Signed)
PT Cancellation Note  Patient Details Name: Christian Branch MRN: [id number] DOB: 1976-01-26   Cancelled Treatment:    Reason Eval/Treat Not Completed: Pain limiting ability to participate (pt stated too fatigued and painful to participate at this time despite premedication and education)   Lanetta Inch [facility] 09/23/2015, 9:42 AM Elwyn Reach, [place]
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
## 2015-09-23 NOTE — Anesthesia Postprocedure Evaluation (Signed)
Anesthesia Post Note  Patient: Christian Branch  Procedure(s) Performed: Procedure(s) (LRB): EXTERNAL FIXATION LEFT LOWER LEG WITH COMPARTMENT RELEASES (Left)  Patient location during evaluation: PACU Anesthesia Type: General Level of consciousness: awake and alert and combative Pain management: pain level not controlled Vital Signs Assessment: post-procedure vital signs reviewed and stable Respiratory status: spontaneous breathing Cardiovascular status: stable Anesthetic complications: no    Last Vitals:  Filed Vitals:   09/22/15 2116 09/23/15 0612  BP: 184/80 186/92  Pulse: 94 80  Temp: 36.8 C 36.4 C  Resp: 17 17    Last Pain:  Filed Vitals:   09/23/15 0623  PainSc: Asleep    LLE Motor Response: Purposeful movement, Responds to commands LLE Sensation: Pain, Full sensation RLE Motor Response: Purposeful movement, Responds to commands        Abhi Moccia,JAMES TERRILL
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
## 2015-09-23 NOTE — Progress Notes (Signed)
OT Cancellation Note  Patient Details Name: Wakefield Brockert MRN: [id number] DOB: 1975-12-01   Cancelled Treatment:    Reason Eval/Treat Not Completed: Patient declined, no reason specified. Pt stated he is too tired and therapy is "too much of a burden" right now. Educated pt on importance of therapy in recovery process and potential for being d/c'ed from OT services after 3 consecutive refusals; pt verbalized understanding.   Binnie Kand M.S., OTR/L Pager: [phone]  09/23/2015, 2:27 PM
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
## 2015-09-24 NOTE — Clinical Social Work Note (Signed)
Clinical Social Worker continuing to follow patient for support and discharge planning needs. Patient awaiting bed availability at a difficult to place facility - currently no bed space available. CSW to follow up with supervisor to discuss additional bed offer options. CSW remains available for support as needed.  Barbette Or, [place]

## 2015-09-25 MED ORDER — CEFAZOLIN SODIUM-DEXTROSE 2-3 GM-% IV SOLR
2.0000 g | INTRAVENOUS | Status: AC
  Administered 2015-09-26 (×2): 2 g via INTRAVENOUS
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
## 2015-09-25 NOTE — Clinical Social Work Note (Signed)
Clinical Social Worker continuing to follow patient for support and discharge planning needs. Patient awaiting bed availability at a difficult to place facility - currently no bed space available.  Per MD note, patient to have surgery tomorrow and then be NWB for 8 weeks.  CSW supervisor updated on patient current medical status in order to pursue appropriate facility options for discharge. CSW remains available for support as needed.  Barbette Or, [place]

## 2015-09-25 NOTE — Progress Notes (Signed)
Orthopaedic Trauma Service  OR tomorrow for ORIF L tibial plateau  NWB x 8 weeks post op  Unrestricted ROM post op  Npo after MN   Jari Pigg, PA-C [facility] [phone] (P) 09/25/2015 09/25/2015
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
## 2015-09-26 ENCOUNTER — Inpatient Hospital Stay (HOSPITAL_COMMUNITY): Payer: Self-pay

## 2015-09-26 ENCOUNTER — Encounter (HOSPITAL_COMMUNITY): Payer: Self-pay | Admitting: Anesthesiology

## 2015-09-26 ENCOUNTER — Inpatient Hospital Stay (HOSPITAL_COMMUNITY): Payer: Self-pay | Admitting: Anesthesiology

## 2015-09-26 ENCOUNTER — Inpatient Hospital Stay (HOSPITAL_COMMUNITY): Payer: MEDICAID | Admitting: Anesthesiology

## 2015-09-26 ENCOUNTER — Encounter (HOSPITAL_COMMUNITY): Admission: EM | Disposition: A | Discharge status: Home / Self Care | Payer: Self-pay | Source: Home / Self Care

## 2015-09-26 HISTORY — PX: ORIF TIBIA PLATEAU: SHX2132

## 2015-09-26 HISTORY — PX: EXTERNAL FIXATION REMOVAL: SHX5040

## 2015-09-26 SURGERY — OPEN REDUCTION INTERNAL FIXATION (ORIF) TIBIAL PLATEAU
Anesthesia: General | Site: Leg Lower | Laterality: Left

## 2015-09-26 MED ORDER — HYDROMORPHONE HCL 1 MG/ML IJ SOLN
0.5000 mg | INTRAMUSCULAR | Status: DC | PRN
  Administered 2015-09-26 (×2): 1 mg via INTRAVENOUS

## 2015-09-26 MED ORDER — HYDROMORPHONE HCL 1 MG/ML IJ SOLN
0.2500 mg | INTRAMUSCULAR | Status: DC | PRN
  Administered 2015-09-26 (×4): 0.5 mg via INTRAVENOUS

## 2015-09-26 MED ORDER — FENTANYL CITRATE (PF) 250 MCG/5ML IJ SOLN
INTRAMUSCULAR | Status: AC
  Filled 2015-09-26: qty 5

## 2015-09-26 MED ORDER — LACTATED RINGERS IV SOLN
INTRAVENOUS | Status: DC | PRN
  Administered 2015-09-26 (×2): via INTRAVENOUS

## 2015-09-26 MED ORDER — SUCCINYLCHOLINE CHLORIDE 20 MG/ML IJ SOLN
INTRAMUSCULAR | Status: AC
  Filled 2015-09-26: qty 1

## 2015-09-26 MED ORDER — HYDROMORPHONE HCL 1 MG/ML IJ SOLN
INTRAMUSCULAR | Status: AC
  Filled 2015-09-26: qty 1

## 2015-09-26 MED ORDER — MIDAZOLAM HCL 2 MG/2ML IJ SOLN
INTRAMUSCULAR | Status: AC
  Filled 2015-09-26: qty 2

## 2015-09-26 MED ORDER — ARTIFICIAL TEARS OP OINT
TOPICAL_OINTMENT | OPHTHALMIC | Status: DC | PRN
  Administered 2015-09-26: 1 via OPHTHALMIC

## 2015-09-26 MED ORDER — ONDANSETRON HCL 4 MG/2ML IJ SOLN
INTRAMUSCULAR | Status: DC | PRN
  Administered 2015-09-26: 4 mg via INTRAVENOUS

## 2015-09-26 MED ORDER — NEOSTIGMINE METHYLSULFATE 10 MG/10ML IV SOLN
INTRAVENOUS | Status: DC | PRN
  Administered 2015-09-26: 4 mg via INTRAVENOUS

## 2015-09-26 MED ORDER — SODIUM CHLORIDE 0.9 % IJ SOLN
INTRAMUSCULAR | Status: AC
  Filled 2015-09-26: qty 10

## 2015-09-26 MED ORDER — MIDAZOLAM HCL 5 MG/5ML IJ SOLN
INTRAMUSCULAR | Status: DC | PRN
  Administered 2015-09-26: 2 mg via INTRAVENOUS

## 2015-09-26 MED ORDER — LIDOCAINE HCL (CARDIAC) 20 MG/ML IV SOLN
INTRAVENOUS | Status: DC | PRN
  Administered 2015-09-26 (×2): 50 mg via INTRAVENOUS

## 2015-09-26 MED ORDER — KETAMINE HCL 10 MG/ML IJ SOLN
INTRAMUSCULAR | Status: DC | PRN
  Administered 2015-09-26: 50 mg via INTRAVENOUS

## 2015-09-26 MED ORDER — KETAMINE HCL 100 MG/ML IJ SOLN
INTRAMUSCULAR | Status: AC
  Filled 2015-09-26: qty 1

## 2015-09-26 MED ORDER — HYDRALAZINE HCL 20 MG/ML IJ SOLN
INTRAMUSCULAR | Status: AC
  Filled 2015-09-26: qty 1

## 2015-09-26 MED ORDER — HYDROMORPHONE HCL 1 MG/ML IJ SOLN
INTRAMUSCULAR | Status: AC
  Filled 2015-09-26: qty 2

## 2015-09-26 MED ORDER — GLYCOPYRROLATE 0.2 MG/ML IJ SOLN
INTRAMUSCULAR | Status: DC | PRN
  Administered 2015-09-26: 0.6 mg via INTRAVENOUS

## 2015-09-26 MED ORDER — CEFAZOLIN SODIUM-DEXTROSE 2-3 GM-% IV SOLR
2.0000 g | Freq: Three times a day (TID) | INTRAVENOUS | Status: AC
  Administered 2015-09-26 – 2015-09-27 (×3): 2 g via INTRAVENOUS
  Filled 2015-09-26 (×3): qty 50

## 2015-09-26 MED ORDER — LIDOCAINE HCL (CARDIAC) 20 MG/ML IV SOLN
INTRAVENOUS | Status: AC
  Filled 2015-09-26: qty 5

## 2015-09-26 MED ORDER — PROMETHAZINE HCL 25 MG/ML IJ SOLN: 6.2500 mg | INTRAMUSCULAR | Status: DC | PRN

## 2015-09-26 MED ORDER — ROCURONIUM BROMIDE 100 MG/10ML IV SOLN
INTRAVENOUS | Status: DC | PRN
  Administered 2015-09-26: 50 mg via INTRAVENOUS
  Administered 2015-09-26: 10 mg via INTRAVENOUS

## 2015-09-26 MED ORDER — HYDROMORPHONE HCL 1 MG/ML IJ SOLN
1.0000 mg | INTRAMUSCULAR | Status: DC | PRN
  Administered 2015-09-26: 2 mg via INTRAVENOUS
  Administered 2015-09-26 (×2): 1 mg via INTRAVENOUS
  Administered 2015-09-27 (×4): 2 mg via INTRAVENOUS
  Filled 2015-09-26: qty 1
  Filled 2015-09-26 (×4): qty 2
  Filled 2015-09-26: qty 1
  Filled 2015-09-26: qty 2

## 2015-09-26 MED ORDER — PROPOFOL 10 MG/ML IV BOLUS
INTRAVENOUS | Status: DC | PRN
Start: 2015-09-26 — End: 2015-09-26
  Administered 2015-09-26: 20 mg via INTRAVENOUS
  Administered 2015-09-26: 180 mg via INTRAVENOUS
  Administered 2015-09-26: 50 mg via INTRAVENOUS

## 2015-09-26 MED ORDER — HYDRALAZINE HCL 20 MG/ML IJ SOLN
INTRAMUSCULAR | Status: DC | PRN
  Administered 2015-09-26: 3 mg via INTRAVENOUS
  Administered 2015-09-26: 2 mg via INTRAVENOUS
  Administered 2015-09-26: 5 mg via INTRAVENOUS

## 2015-09-26 MED ORDER — 0.9 % SODIUM CHLORIDE (POUR BTL) OPTIME
TOPICAL | Status: DC | PRN
  Administered 2015-09-26: 1000 mL

## 2015-09-26 MED ORDER — EPHEDRINE SULFATE 50 MG/ML IJ SOLN
INTRAMUSCULAR | Status: AC
  Filled 2015-09-26: qty 1

## 2015-09-26 MED ORDER — ONDANSETRON HCL 4 MG/2ML IJ SOLN
INTRAMUSCULAR | Status: AC
  Filled 2015-09-26: qty 2

## 2015-09-26 MED ORDER — FENTANYL CITRATE (PF) 100 MCG/2ML IJ SOLN
INTRAMUSCULAR | Status: DC | PRN
  Administered 2015-09-26: 100 ug via INTRAVENOUS
  Administered 2015-09-26: 50 ug via INTRAVENOUS
  Administered 2015-09-26 (×2): 100 ug via INTRAVENOUS
  Administered 2015-09-26 (×3): 50 ug via INTRAVENOUS
  Administered 2015-09-26: 100 ug via INTRAVENOUS

## 2015-09-26 MED ORDER — KETOROLAC TROMETHAMINE 30 MG/ML IJ SOLN
INTRAMUSCULAR | Status: AC
  Filled 2015-09-26: qty 1

## 2015-09-26 MED ORDER — OXYCODONE HCL 5 MG PO TABS
ORAL_TABLET | ORAL | Status: AC
  Filled 2015-09-26: qty 4

## 2015-09-26 MED ORDER — KETOROLAC TROMETHAMINE 30 MG/ML IJ SOLN
30.0000 mg | Freq: Once | INTRAMUSCULAR | Status: AC | PRN
  Administered 2015-09-26: 30 mg via INTRAVENOUS

## 2015-09-26 MED ORDER — ARTIFICIAL TEARS OP OINT
TOPICAL_OINTMENT | OPHTHALMIC | Status: AC
  Filled 2015-09-26: qty 3.5

## 2015-09-26 MED ORDER — PROPOFOL 10 MG/ML IV BOLUS
INTRAVENOUS | Status: AC
  Filled 2015-09-26: qty 40

## 2015-09-26 MED ORDER — ROCURONIUM BROMIDE 50 MG/5ML IV SOLN
INTRAVENOUS | Status: AC
  Filled 2015-09-26: qty 2

## 2015-09-26 MED ORDER — PHENYLEPHRINE HCL 10 MG/ML IJ SOLN
INTRAMUSCULAR | Status: DC | PRN
  Administered 2015-09-26: 80 ug via INTRAVENOUS

## 2015-09-26 MED ORDER — ALBUMIN HUMAN 5 % IV SOLN
INTRAVENOUS | Status: DC | PRN
  Administered 2015-09-26: 12:00:00 via INTRAVENOUS

## 2015-09-26 SURGICAL SUPPLY — 99 items
BANDAGE ELASTIC 4 VELCRO ST LF (GAUZE/BANDAGES/DRESSINGS) ×3 IMPLANT
BANDAGE ELASTIC 6 VELCRO ST LF (GAUZE/BANDAGES/DRESSINGS) ×3 IMPLANT
BANDAGE ESMARK 6X9 LF (GAUZE/BANDAGES/DRESSINGS) ×2 IMPLANT
BIT DRILL 2 FAST STEP (BIT) ×3 IMPLANT
BLADE SURG 10 STRL SS (BLADE) ×3 IMPLANT
BLADE SURG 15 STRL LF DISP TIS (BLADE) ×2 IMPLANT
BLADE SURG 15 STRL SS (BLADE) ×1
BLADE SURG ROTATE 9660 (MISCELLANEOUS) IMPLANT
BNDG COHESIVE 4X5 TAN STRL (GAUZE/BANDAGES/DRESSINGS) ×3 IMPLANT
BNDG ESMARK 6X9 LF (GAUZE/BANDAGES/DRESSINGS) ×3
BNDG GAUZE ELAST 4 BULKY (GAUZE/BANDAGES/DRESSINGS) ×6 IMPLANT
BONE CANC CHIPS 40CC CAN1/2 (Bone Implant) ×6 IMPLANT
BRUSH SCRUB DISP (MISCELLANEOUS) ×6 IMPLANT
CANISTER SUCT 3000ML PPV (MISCELLANEOUS) ×3 IMPLANT
CHIPS CANC BONE 40CC CAN1/2 (Bone Implant) ×4 IMPLANT
COVER MAYO STAND STRL (DRAPES) ×3 IMPLANT
COVER SURGICAL LIGHT HANDLE (MISCELLANEOUS) ×3 IMPLANT
DRAPE C-ARM 42X72 X-RAY (DRAPES) ×3 IMPLANT
DRAPE C-ARMOR (DRAPES) ×3 IMPLANT
DRAPE EXTREMITY T 121X128X90 (DRAPE) IMPLANT
DRAPE INCISE IOBAN 66X45 STRL (DRAPES) ×3 IMPLANT
DRAPE ORTHO SPLIT 77X108 STRL (DRAPES)
DRAPE SURG ORHT 6 SPLT 77X108 (DRAPES) IMPLANT
DRAPE U-SHAPE 47X51 STRL (DRAPES) ×6 IMPLANT
DRILL BIT 2.5X100 214235007 (MISCELLANEOUS) ×3 IMPLANT
DRSG ADAPTIC 3X8 NADH LF (GAUZE/BANDAGES/DRESSINGS) ×3 IMPLANT
DRSG MEPITEL 4X7.2 (GAUZE/BANDAGES/DRESSINGS) ×6 IMPLANT
DRSG PAD ABDOMINAL 8X10 ST (GAUZE/BANDAGES/DRESSINGS) ×6 IMPLANT
ELECT REM PT RETURN 9FT ADLT (ELECTROSURGICAL) ×3
ELECTRODE REM PT RTRN 9FT ADLT (ELECTROSURGICAL) ×2 IMPLANT
EVACUATOR 1/8 PVC DRAIN (DRAIN) IMPLANT
EVACUATOR 3/16  PVC DRAIN (DRAIN)
EVACUATOR 3/16 PVC DRAIN (DRAIN) IMPLANT
GAUZE SPONGE 4X4 12PLY STRL (GAUZE/BANDAGES/DRESSINGS) ×3 IMPLANT
GLOVE BIO SURGEON STRL SZ 6.5 (GLOVE) ×3 IMPLANT
GLOVE BIO SURGEON STRL SZ7.5 (GLOVE) ×6 IMPLANT
GLOVE BIO SURGEON STRL SZ8 (GLOVE) ×6 IMPLANT
GLOVE BIOGEL PI IND STRL 6.5 (GLOVE) ×2 IMPLANT
GLOVE BIOGEL PI IND STRL 7.0 (GLOVE) ×4 IMPLANT
GLOVE BIOGEL PI IND STRL 7.5 (GLOVE) ×2 IMPLANT
GLOVE BIOGEL PI IND STRL 8 (GLOVE) ×4 IMPLANT
GLOVE BIOGEL PI INDICATOR 6.5 (GLOVE) ×1
GLOVE BIOGEL PI INDICATOR 7.0 (GLOVE) ×2
GLOVE BIOGEL PI INDICATOR 7.5 (GLOVE) ×1
GLOVE BIOGEL PI INDICATOR 8 (GLOVE) ×2
GOWN STRL REUS W/ TWL LRG LVL3 (GOWN DISPOSABLE) ×4 IMPLANT
GOWN STRL REUS W/ TWL XL LVL3 (GOWN DISPOSABLE) ×2 IMPLANT
GOWN STRL REUS W/TWL LRG LVL3 (GOWN DISPOSABLE) ×2
GOWN STRL REUS W/TWL XL LVL3 (GOWN DISPOSABLE) ×1
IMMOBILIZER KNEE 22 UNIV (SOFTGOODS) ×3 IMPLANT
KIT BASIN OR (CUSTOM PROCEDURE TRAY) ×3 IMPLANT
KIT ROOM TURNOVER OR (KITS) ×3 IMPLANT
NDL SUT 6 .5 CRC .975X.05 MAYO (NEEDLE) IMPLANT
NEEDLE 22X1 1/2 (OR ONLY) (NEEDLE) IMPLANT
NEEDLE MAYO TAPER (NEEDLE)
NS IRRIG 1000ML POUR BTL (IV SOLUTION) ×3 IMPLANT
PACK ORTHO EXTREMITY (CUSTOM PROCEDURE TRAY) ×3 IMPLANT
PAD ARMBOARD 7.5X6 YLW CONV (MISCELLANEOUS) ×6 IMPLANT
PAD CAST 4YDX4 CTTN HI CHSV (CAST SUPPLIES) ×2 IMPLANT
PADDING CAST COTTON 4X4 STRL (CAST SUPPLIES) ×1
PADDING CAST COTTON 6X4 STRL (CAST SUPPLIES) ×3 IMPLANT
PLATE LOCKING 2.5 STRAIGHT (Plate) ×3 IMPLANT
SCREW CORT FT 32X3.5XNONLOCK (Screw) ×2 IMPLANT
SCREW CORTICAL 3.5MM  32MM (Screw) ×1 IMPLANT
SCREW CORTICAL 3.5MM  34MM (Screw) ×2 IMPLANT
SCREW CORTICAL 3.5MM 34MM (Screw) ×4 IMPLANT
SCREW CORTICAL 3.5MM 38MM (Screw) ×3 IMPLANT
SCREW LOCK 3.5X70 816135070 (Screw) ×3 IMPLANT
SCREW LOCK 3.5X75 816135075 DU (Screw) ×12 IMPLANT
SCREW LOCK CORT STAR 3.5X65 (Screw) ×6 IMPLANT
SCREW LP 3.5X75MM (Screw) ×6 IMPLANT
SCREW MULTI DIRECT 28MM (Screw) ×3 IMPLANT
SCREW PEG 2.5X24 NONLOCK (Screw) ×3 IMPLANT
SCREW PEG LOCK 2.5X28 (Peg) ×3 IMPLANT
SPONGE GAUZE 4X4 12PLY STER LF (GAUZE/BANDAGES/DRESSINGS) ×3 IMPLANT
SPONGE LAP 18X18 X RAY DECT (DISPOSABLE) ×3 IMPLANT
STAPLER VISISTAT 35W (STAPLE) ×3 IMPLANT
STOCKINETTE IMPERVIOUS LG (DRAPES) ×3 IMPLANT
SUCTION FRAZIER TIP 10 FR DISP (SUCTIONS) ×3 IMPLANT
SUCTION FRAZIER TIP 8 FR DISP (SUCTIONS) ×1
SUCTION TUBE FRAZIER 8FR DISP (SUCTIONS) ×2 IMPLANT
SUT ETHILON 3 0 PS 1 (SUTURE) IMPLANT
SUT PROLENE 0 CT 2 (SUTURE) ×12 IMPLANT
SUT VIC AB 0 CT1 27 (SUTURE) ×1
SUT VIC AB 0 CT1 27XBRD ANBCTR (SUTURE) ×2 IMPLANT
SUT VIC AB 1 CT1 27 (SUTURE) ×1
SUT VIC AB 1 CT1 27XBRD ANBCTR (SUTURE) ×2 IMPLANT
SUT VIC AB 2-0 CT1 27 (SUTURE) ×2
SUT VIC AB 2-0 CT1 TAPERPNT 27 (SUTURE) ×4 IMPLANT
SYR 20ML ECCENTRIC (SYRINGE) IMPLANT
SYR BULB IRRIGATION 50ML (SYRINGE) ×3 IMPLANT
TOWEL OR 17X24 6PK STRL BLUE (TOWEL DISPOSABLE) ×3 IMPLANT
TOWEL OR 17X26 10 PK STRL BLUE (TOWEL DISPOSABLE) ×6 IMPLANT
TRAY FOLEY CATH 16FRSI W/METER (SET/KITS/TRAYS/PACK) IMPLANT
TUBE CONNECTING 12X1/4 (SUCTIONS) ×3 IMPLANT
WASHER 2.5 THREADED (Orthopedic Implant) ×3 IMPLANT
WATER STERILE IRR 1000ML POUR (IV SOLUTION) ×6 IMPLANT
WIRE K 1.6MM 144256 (MISCELLANEOUS) ×9 IMPLANT
YANKAUER SUCT BULB TIP NO VENT (SUCTIONS) ×3 IMPLANT

## 2015-09-26 NOTE — Progress Notes (Signed)
Per lab, patient refused labs being drawn this AM.
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
## 2015-09-26 NOTE — Anesthesia Procedure Notes (Signed)
Procedure Name: Intubation Date/Time: 09/26/2015 9:17 AM Performed by: Scheryl Darter Pre-anesthesia Checklist: Patient identified, Emergency Drugs available, Suction available, Patient being monitored and Timeout performed Patient Re-evaluated:Patient Re-evaluated prior to inductionOxygen Delivery Method: Circle system utilized Preoxygenation: Pre-oxygenation with 100% oxygen Intubation Type: IV induction Ventilation: Mask ventilation without difficulty Laryngoscope Size: Mac and 4 Grade View: Grade I Tube type: Oral Tube size: 7.5 mm Number of attempts: 1 Airway Equipment and Method: Stylet Placement Confirmation: ETT inserted through vocal cords under direct vision,  positive ETCO2,  CO2 detector and breath sounds checked- equal and bilateral Secured at: 23 cm Tube secured with: Tape Dental Injury: Teeth and Oropharynx as per pre-operative assessment

## 2015-09-26 NOTE — Progress Notes (Signed)
PT Cancellation Note  Patient Details Name: Christian Branch MRN: [id number] DOB: 1976-03-12   Cancelled Treatment:    Reason Eval/Treat Not Completed: Patient at procedure or test/unavailable   Darliss Cheney, PTA [phone] 09/26/2015, 3:59 PM

## 2015-09-26 NOTE — Progress Notes (Signed)
Pt s/p Lt ORIF tibial plateau and removal of external fixator Lt leg today.  CSW continues to follow for possible skilled nursing facility placement with LOG.  Hopeful that pt will have increased mobility with removal of ex fix.  Await post op PT/OT consults to determine if pt will still need SNF at dc.    Will follow progress.    Reinaldo Raddle, RN, BSN  Trauma/Neuro ICU Case Manager [phone]

## 2015-09-26 NOTE — Anesthesia Postprocedure Evaluation (Signed)
Anesthesia Post Note  Patient: Raelyn Number  Procedure(s) Performed: Procedure(s) (LRB): LEFT OPEN REDUCTION INTERNAL FIXATION (ORIF) TIBIAL PLATEAU  (Left) REMOVAL EXTERNAL FIXATION LEG (Left)  Patient location during evaluation: PACU Anesthesia Type: General Level of consciousness: awake and alert Pain management: pain level controlled Vital Signs Assessment: post-procedure vital signs reviewed and stable Respiratory status: spontaneous breathing, nonlabored ventilation, respiratory function stable and patient connected to nasal cannula oxygen Cardiovascular status: blood pressure returned to baseline and stable Postop Assessment: no signs of nausea or vomiting Anesthetic complications: no    Last Vitals:  Filed Vitals:   09/26/15 1400 09/26/15 1415  BP: 128/85 167/87  Pulse: 124   Temp:    Resp:      Last Pain:  Filed Vitals:   09/26/15 1420  PainSc: Asleep    LLE Motor Response: Responds to commands LLE Sensation: Full sensation RLE Motor Response: Purposeful movement, Responds to commands        Zenaida Deed

## 2015-09-26 NOTE — Transfer of Care (Signed)
Immediate Anesthesia Transfer of Care Note  Patient: Christian Branch  Procedure(s) Performed: Procedure(s): LEFT OPEN REDUCTION INTERNAL FIXATION (ORIF) TIBIAL PLATEAU  (Left) REMOVAL EXTERNAL FIXATION LEG (Left)  Patient Location: PACU  Anesthesia Type:General  Level of Consciousness: awake, alert , oriented and sedated  Airway & Oxygen Therapy: Patient Spontanous Breathing and Patient connected to nasal cannula oxygen  Post-op Assessment: Report given to RN, Post -op Vital signs reviewed and stable and Patient moving all extremities  Post vital signs: Reviewed and stable  Last Vitals:  Filed Vitals:   09/25/15 2022 09/26/15 0553  BP: 132/77 171/88  Pulse: 89 82  Temp: 36.8 C 36.7 C  Resp: 16 16    Complications: No apparent anesthesia complications

## 2015-09-26 NOTE — Anesthesia Preprocedure Evaluation (Signed)
Anesthesia Evaluation  Patient identified by MRN, date of birth, ID band Patient awake    Reviewed: Allergy & Precautions, NPO status , Patient's Chart, lab work & pertinent test results  Airway Mallampati: II  TM Distance: >3 FB Neck ROM: Full    Dental no notable dental hx.    Pulmonary neg pulmonary ROS,    Pulmonary exam normal breath sounds clear to auscultation       Cardiovascular negative cardio ROS Normal cardiovascular exam Rhythm:Regular Rate:Normal     Neuro/Psych negative neurological ROS  negative psych ROS   GI/Hepatic negative GI ROS, Neg liver ROS,   Endo/Other  negative endocrine ROS  Renal/GU negative Renal ROS  negative genitourinary   Musculoskeletal negative musculoskeletal ROS (+)   Abdominal   Peds negative pediatric ROS (+)  Hematology  (+) anemia ,   Anesthesia Other Findings   Reproductive/Obstetrics negative OB ROS                             Anesthesia Physical Anesthesia Plan  ASA: II  Anesthesia Plan: General   Post-op Pain Management:    Induction: Intravenous  Airway Management Planned: Oral ETT and LMA  Additional Equipment:   Intra-op Plan:   Post-operative Plan: Extubation in OR  Informed Consent: I have reviewed the patients History and Physical, chart, labs and discussed the procedure including the risks, benefits and alternatives for the proposed anesthesia with the patient or authorized representative who has indicated his/her understanding and acceptance.   Dental advisory given  Plan Discussed with: CRNA and Surgeon  Anesthesia Plan Comments:         Anesthesia Quick Evaluation

## 2015-09-26 NOTE — Progress Notes (Signed)
Report given to robin roberts rn as caregiver 

## 2015-09-26 NOTE — Progress Notes (Signed)
Orthopedic Tech Progress Note Patient Details:  Christian Branch [birth year] [id number]  Ortho Devices Type of Ortho Device: Ace wrap, Long leg splint Ortho Device/Splint Location: footsie roll Ortho Device/Splint Interventions: Ordered, Application   Braulio Bosch 09/26/2015, 4:27 PM

## 2015-09-26 NOTE — Progress Notes (Signed)
Orthopedic Tech Progress Note Patient Details:  Christian Branch [birth year] [id number] Called [facility] for brace order. Patient ID: Christian Branch, male   DOB: Mar 06, 1976, 39 y.o.   MRN: [id number]   Christian Branch 09/26/2015, 4:05 PM

## 2015-09-27 ENCOUNTER — Encounter (HOSPITAL_COMMUNITY): Payer: Self-pay | Admitting: Orthopedic Surgery

## 2015-09-27 MED ORDER — CEFAZOLIN SODIUM-DEXTROSE 2-3 GM-% IV SOLR
2.0000 g | Freq: Three times a day (TID) | INTRAVENOUS | Status: DC
Start: 2015-09-27 — End: 2015-09-28
  Administered 2015-09-27 – 2015-09-28 (×4): 2 g via INTRAVENOUS
  Filled 2015-09-27 (×7): qty 50

## 2015-09-27 MED ORDER — HYDROMORPHONE HCL 1 MG/ML IJ SOLN
1.0000 mg | INTRAMUSCULAR | Status: DC | PRN
  Administered 2015-09-27: 1 mg via INTRAVENOUS
  Filled 2015-09-27: qty 1

## 2015-09-27 MED ORDER — OXYCODONE HCL ER 40 MG PO T12A
40.0000 mg | EXTENDED_RELEASE_TABLET | Freq: Two times a day (BID) | ORAL | Status: DC
  Administered 2015-09-27 – 2015-09-30 (×8): 40 mg via ORAL
  Filled 2015-09-27 (×8): qty 1

## 2015-09-27 NOTE — Anesthesia Postprocedure Evaluation (Signed)
Anesthesia Post Note  Patient: Christian Branch  Procedure(s) Performed: Procedure(s) (LRB): LEFT OPEN REDUCTION INTERNAL FIXATION (ORIF) TIBIAL PLATEAU  (Left) REMOVAL EXTERNAL FIXATION LEG (Left)  Patient location during evaluation: PACU Anesthesia Type: General Level of consciousness: awake and alert Pain management: pain level controlled Vital Signs Assessment: post-procedure vital signs reviewed and stable Respiratory status: spontaneous breathing, nonlabored ventilation, respiratory function stable and patient connected to nasal cannula oxygen Cardiovascular status: blood pressure returned to baseline and stable Postop Assessment: no signs of nausea or vomiting Anesthetic complications: no    Last Vitals:  Filed Vitals:   09/27/15 0444 09/27/15 1300  BP: 154/90 134/96  Pulse: 125 111  Temp: 36.8 C 37.1 C  Resp: 18 18    Last Pain:  Filed Vitals:   09/27/15 1413  PainSc: 9                  Cherisa Brucker S

## 2015-09-27 NOTE — Progress Notes (Signed)
Orthopedic Tech Progress Note Patient Details:  Christian Branch [birth year] [id number]  Ortho Devices Type of Ortho Device:  (footsie roll) Ortho Device/Splint Location: lle Ortho Device/Splint Interventions: Loanne Drilling, Colleen Kotlarz 09/27/2015, 10:53 AM
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
## 2015-09-27 NOTE — Progress Notes (Signed)
OT Cancellation Note  Patient Details Name: Christian Branch MRN: [id number] DOB: [birth year]   Cancelled Treatment:    Reason Eval/Treat Not Completed: Patient declined, no reason specified.  Attempted skilled OT.  Pt. States "um, im trying to take a piss right now" (in dark room using urinal under the covers).  Apologized and stepped behind the curtain to provide the requested privacy.  Introduced myself and asked if he would be ready to begin skilled OT after he finished using the urinal.  Pt. States "oh no".  i asked "so you do not feel you can tolerate skilled OT today".  Pt. Replied "hell no".  Informed him we will try back another time.    Janice Coffin, COTA/L 09/27/2015, 9:59 AM
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
## 2015-09-27 NOTE — Progress Notes (Addendum)
PT Cancellation Note  Patient Details Name: [name] MRN: [id number] DOB: [birth year]   Cancelled Treatment:    Reason Eval/Treat Not Completed: Patient declined; no reason specified. Patient is easily agitated. Informed patient we will check on him another time for therapy.   Darliss Cheney, PTA [phone] 09/27/2015, 1:18 PM

## 2015-09-28 MED ORDER — CEPHALEXIN 500 MG PO CAPS: 2000.0000 mg | ORAL_CAPSULE | Freq: Three times a day (TID) | ORAL | Status: DC
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
## 2015-09-28 NOTE — Progress Notes (Signed)
CSW AD informed of PT recommendation and will continue to work to secure NH bed for pt.  Creta Levin, Saturday Coverage [phone]
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
## 2015-09-28 NOTE — Progress Notes (Signed)
OT NOTE  Attempted x2 for skilled OT this a.m.  Both times pt. Reports not able.  Will check back as able.   Romana Juniper, COTA/L
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
## 2015-10-01 MED ORDER — OXYCODONE HCL ER 10 MG PO T12A
60.0000 mg | EXTENDED_RELEASE_TABLET | Freq: Two times a day (BID) | ORAL | Status: DC
  Administered 2015-10-01 – 2015-10-03 (×5): 60 mg via ORAL
  Filled 2015-10-01 (×5): qty 1

## 2015-10-01 MED ORDER — BISACODYL 5 MG PO TBEC: 5.0000 mg | DELAYED_RELEASE_TABLET | Freq: Every day | ORAL | Status: DC | PRN

## 2015-10-01 NOTE — Progress Notes (Signed)
Pt is refusing medication from me he took his 10am and refused everything else
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
## 2015-10-01 NOTE — Progress Notes (Signed)
Pt just called me and asked if I would bring his medications in and then when I attempted he said hell no no no so I removed myself from the room
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
## 2015-10-02 NOTE — Clinical Social Work Note (Signed)
Clinical Social Worker continuing to follow patient and family for support and discharge planning needs.  CSW has communicated with PA/MD who state that patient is medically stable for transfer to shelter.  CSW contacted Celesta Gentile at the [facility]) who states that when patient is discharged in the morning he must check in at the [facility] and they will place him in a shelter from there with transportation provided.  CSW to arrange tomorrow morning for patient transfer to [facility].  CSW remains available for support and to facilitate patient discharge needs.  Barbette Or, [place]
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
## 2015-10-03 MED ORDER — TRAMADOL HCL 50 MG PO TABS: 100.0000 mg | ORAL_TABLET | Freq: Four times a day (QID) | ORAL | Status: DC

## 2015-10-03 MED ORDER — OXYCODONE HCL 10 MG PO TABS: 10.0000 mg | ORAL_TABLET | ORAL | Status: DC | PRN

## 2015-10-03 MED ORDER — POLYETHYLENE GLYCOL 3350 17 G PO PACK: 17.0000 g | PACK | Freq: Two times a day (BID) | ORAL | Status: DC

## 2015-10-03 MED ORDER — ASPIRIN EC 325 MG PO TBEC: 325.0000 mg | DELAYED_RELEASE_TABLET | Freq: Every day | ORAL | Status: DC

## 2015-10-03 MED ORDER — METHOCARBAMOL 500 MG PO TABS: 1000.0000 mg | ORAL_TABLET | Freq: Four times a day (QID) | ORAL | Status: DC | PRN

## 2015-10-03 MED ORDER — OXYCODONE HCL ER 10 MG PO T12A: EXTENDED_RELEASE_TABLET | ORAL | Status: DC

## 2015-10-03 NOTE — Progress Notes (Signed)
CSW spoke with nurse case manager and PA this morning to assist with plans for discharge today.  Patient is to go to [facility]. Plan is to meet with Celesta Gentile who will assist with housing placement and transportation to housing for patient.  CSW went to outpatient pharmacy to pick up patient's medication as he has no transportation. CSW delivered medications to nurse and patient informed. Cab voucher also provided for transport to [facility]. CSW informed patient of plan.  Patient with questions regarding specific housing and informed Terrance at [facility] would address questions when he meets with patient today. No further needs expressed.  Madelaine Bhat, [place]
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
## 2015-10-03 NOTE — Care Management Note (Signed)
Case Management Note  Patient Details  Name: Christian Branch MRN: [id number] Date of Birth: 07/01/1976  Subjective/Objective:  Pt for dc today; arrangements have been made through [facility] for pt to be transported via cab to [facility] in [place] to meet with Izora Gala, who will then assist with housing in shelter.  Pt is uninsured, but is eligible for medication assistance through [facility] program.  Pt will need RW, per PT recommendations.                    Action/Plan: Referral to [facility] for DME needs.  CSW took all Rx to [facility] to be filled with [facility] letter, and delivered all meds to bedside nurse.  RW to be delivered to pt's room prior to dc to shelter.     Expected Discharge Date:    10/03/2015             Expected Discharge Plan:  Homeless Shelter  In-House Referral:  Clinical Social Work  Discharge planning Services  CM Consult  Post Acute Care Choice:  Durable Medical Equipment Choice offered to:  Patient  DME Arranged:  [place] rolling DME Agency:  [place]:    [place] Agency:     Status of Service:  Completed, signed off  Medicare Important Message Given:    Date Medicare IM Given:    Medicare IM give by:    Date Additional Medicare IM Given:    Additional Medicare Important Message give by:     If discussed at [place] of Stay Meetings, dates discussed:    Additional Comments:  Reinaldo Raddle, RN, BSN  Trauma/Neuro ICU Case Manager [phone]

## 2015-10-03 NOTE — Progress Notes (Signed)
All prescriptions were filled except the Asprin. RN spoke with Erby Pian NP, who is aware that patient will have to pick that medication up his self once discharged today.  Pt made aware that he is responsible for getting the Asprin.

## 2015-10-03 NOTE — Progress Notes (Signed)
Patient refusing Colace and Miralax at this time. Educated the patient on the benefit of the laxative and stool softener due to narcotics causing constipation; patient still refuses. Nursing will continue to monitor.

## 2015-10-03 NOTE — Progress Notes (Signed)
RN went over discharge instructions with patient. Pt is aware that he needs to make a follow up appointment to see Dr Marcelino Scot in 2 weeks. Dressing to LLE was changed with 4x4 guaze and wrapped in ace wrap from thigh to foot.  Prescriptions were given to patient as well and reviewed with patient on how to take them. Pt was discharged via taxi voucher to go to [facility].

## 2015-10-03 NOTE — Discharge Instructions (Addendum)
Extended release oxycontin taper 50mg  twice daily x3 days 40mg  tice daily x3 days 30mg  twice daily x3 days 20mg  twice daily x3 days 10mg  twice daily x3 d ays Then stop.  You also have oxycodone that works fast which you can take every 4-6 hours, reduce this as well as your pain gets better.  Be sure to call Dr. Carlean Jews office ASAP to schedule a follow up. Dr. Marcelino Scot will manage your pain medication after this prescription is out.   Daily dressing changes as needed to Left leg. Ok to shower and clean wounds with soap and water. Only need to use dry dressing. 4x4 gauze and ace wrap from foot to thigh   Move L knee as much as possible. It will be uncomfortable but you need to move your knee No bearing weight through left leg for another 7 weeks

## 2015-10-04 ENCOUNTER — Encounter (HOSPITAL_COMMUNITY): Payer: Self-pay | Admitting: Emergency Medicine

## 2015-12-24 ENCOUNTER — Other Ambulatory Visit (HOSPITAL_COMMUNITY): Payer: Self-pay | Admitting: Orthopedic Surgery

## 2015-12-24 DIAGNOSIS — S82142K Displaced bicondylar fracture of left tibia, subsequent encounter for closed fracture with nonunion: Secondary | ICD-10-CM

## 2015-12-30 ENCOUNTER — Ambulatory Visit (HOSPITAL_COMMUNITY): Payer: MEDICAID

## 2016-01-07 ENCOUNTER — Ambulatory Visit
Admission: RE | Admit: 2016-01-07 | Discharge: 2016-01-07 | Disposition: A | Discharge status: Home / Self Care | Payer: Self-pay | Source: Ambulatory Visit | Attending: Orthopedic Surgery | Admitting: Orthopedic Surgery

## 2016-01-07 DIAGNOSIS — S82142K Displaced bicondylar fracture of left tibia, subsequent encounter for closed fracture with nonunion: Secondary | ICD-10-CM

## 2016-01-29 ENCOUNTER — Encounter (HOSPITAL_COMMUNITY): Payer: Self-pay | Admitting: Orthopedic Surgery
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
## 2016-01-29 NOTE — Progress Notes (Signed)
Unable to reach pt via phone. Called Dr. Carlean Jews office and spoke with [facility]. She stated that we will not be able to reach pt. He was given a number to call us, but unfortunately it was a wrong number. Gwen states she instructed pt to be NPO after midnight tonight and to arrive at 5:30 AM tomorrow.

## 2016-01-30 ENCOUNTER — Encounter (HOSPITAL_COMMUNITY)
Admission: RE | Disposition: A | Discharge status: Home / Self Care | Payer: Self-pay | Source: Ambulatory Visit | Attending: Orthopedic Surgery

## 2016-01-30 ENCOUNTER — Encounter (HOSPITAL_COMMUNITY): Payer: Self-pay | Admitting: Anesthesiology

## 2016-01-30 ENCOUNTER — Inpatient Hospital Stay (HOSPITAL_COMMUNITY): Payer: Self-pay

## 2016-01-30 ENCOUNTER — Inpatient Hospital Stay (HOSPITAL_COMMUNITY)
Admission: RE | Admit: 2016-01-30 | Discharge: 2016-02-02 | DRG: 493 | Disposition: A | Discharge status: Home / Self Care | Payer: Self-pay | Source: Ambulatory Visit | Attending: Orthopedic Surgery | Admitting: Orthopedic Surgery

## 2016-01-30 ENCOUNTER — Inpatient Hospital Stay (HOSPITAL_COMMUNITY): Payer: Self-pay | Admitting: Anesthesiology

## 2016-01-30 DIAGNOSIS — R52 Pain, unspecified: Secondary | ICD-10-CM

## 2016-01-30 DIAGNOSIS — Z419 Encounter for procedure for purposes other than remedying health state, unspecified: Secondary | ICD-10-CM

## 2016-01-30 DIAGNOSIS — I10 Essential (primary) hypertension: Secondary | ICD-10-CM | POA: Diagnosis present

## 2016-01-30 DIAGNOSIS — Y831 Surgical operation with implant of artificial internal device as the cause of abnormal reaction of the patient, or of later complication, without mention of misadventure at the time of the procedure: Secondary | ICD-10-CM | POA: Diagnosis present

## 2016-01-30 DIAGNOSIS — T8484XA Pain due to internal orthopedic prosthetic devices, implants and grafts, initial encounter: Principal | ICD-10-CM | POA: Diagnosis present

## 2016-01-30 DIAGNOSIS — M24572 Contracture, left ankle: Secondary | ICD-10-CM | POA: Diagnosis present

## 2016-01-30 DIAGNOSIS — T79A0XA Compartment syndrome, unspecified, initial encounter: Secondary | ICD-10-CM | POA: Diagnosis present

## 2016-01-30 DIAGNOSIS — S82142N Displaced bicondylar fracture of left tibia, subsequent encounter for open fracture type IIIA, IIIB, or IIIC with nonunion: Secondary | ICD-10-CM

## 2016-01-30 DIAGNOSIS — S82143A Displaced bicondylar fracture of unspecified tibia, initial encounter for closed fracture: Secondary | ICD-10-CM | POA: Insufficient documentation

## 2016-01-30 DIAGNOSIS — W3400XA Accidental discharge from unspecified firearms or gun, initial encounter: Secondary | ICD-10-CM

## 2016-01-30 DIAGNOSIS — S81832A Puncture wound without foreign body, left lower leg, initial encounter: Secondary | ICD-10-CM

## 2016-01-30 DIAGNOSIS — Z590 Homelessness: Secondary | ICD-10-CM

## 2016-01-30 DIAGNOSIS — S82142R Displaced bicondylar fracture of left tibia, subsequent encounter for open fracture type IIIA, IIIB, or IIIC with malunion: Secondary | ICD-10-CM | POA: Diagnosis present

## 2016-01-30 HISTORY — DX: Displaced bicondylar fracture of left tibia, subsequent encounter for open fracture type IIIA, IIIB, or IIIC with malunion: S82.142R

## 2016-01-30 HISTORY — DX: Cannabis use, unspecified, uncomplicated: F12.90

## 2016-01-30 HISTORY — PX: HARDWARE REMOVAL: SHX979

## 2016-01-30 HISTORY — PX: ORIF TIBIA FRACTURE: SHX5416

## 2016-01-30 HISTORY — DX: Contracture, left ankle: M24.572

## 2016-01-30 HISTORY — DX: Essential (primary) hypertension: I10

## 2016-01-30 HISTORY — PX: TIBIA OSTEOTOMY: SHX1065

## 2016-01-30 LAB — RAPID URINE DRUG SCREEN, HOSP PERFORMED
AMPHETAMINES: NOT DETECTED
Barbiturates: NOT DETECTED
Benzodiazepines: NOT DETECTED
Cocaine: NOT DETECTED
Opiates: NOT DETECTED
Tetrahydrocannabinol: POSITIVE — AB

## 2016-01-30 LAB — CBC WITH DIFFERENTIAL/PLATELET
BASOS PCT: 1 %
Basophils Absolute: 0.1 10*3/uL (ref 0.0–0.1)
EOS ABS: 0.1 10*3/uL (ref 0.0–0.7)
EOS PCT: 2 %
HCT: 45.6 % (ref 39.0–52.0)
Hemoglobin: 15 g/dL (ref 13.0–17.0)
LYMPHS ABS: 2.2 10*3/uL (ref 0.7–4.0)
Lymphocytes Relative: 43 %
MCH: 27.9 pg (ref 26.0–34.0)
MCHC: 32.9 g/dL (ref 30.0–36.0)
MCV: 84.9 fL (ref 78.0–100.0)
MONOS PCT: 8 %
Monocytes Absolute: 0.4 10*3/uL (ref 0.1–1.0)
Neutro Abs: 2.4 10*3/uL (ref 1.7–7.7)
Neutrophils Relative %: 46 %
PLATELETS: 273 10*3/uL (ref 150–400)
RBC: 5.37 MIL/uL (ref 4.22–5.81)
RDW: 14.4 % (ref 11.5–15.5)
WBC: 5.2 10*3/uL (ref 4.0–10.5)

## 2016-01-30 LAB — COMPREHENSIVE METABOLIC PANEL
ALT: 21 U/L (ref 17–63)
ANION GAP: 12 (ref 5–15)
AST: 23 U/L (ref 15–41)
Albumin: 4.1 g/dL (ref 3.5–5.0)
Alkaline Phosphatase: 68 U/L (ref 38–126)
BUN: 15 mg/dL (ref 6–20)
CHLORIDE: 106 mmol/L (ref 101–111)
CO2: 24 mmol/L (ref 22–32)
Calcium: 9.3 mg/dL (ref 8.9–10.3)
Creatinine, Ser: 1.05 mg/dL (ref 0.61–1.24)
GFR calc non Af Amer: >60 mL/min (ref >60)
Glucose, Bld: 112 mg/dL — ABNORMAL HIGH (ref 65–99)
POTASSIUM: 3.7 mmol/L (ref 3.5–5.1)
SODIUM: 142 mmol/L (ref 135–145)
Total Bilirubin: 0.8 mg/dL (ref 0.3–1.2)
Total Protein: 7.5 g/dL (ref 6.5–8.1)

## 2016-01-30 LAB — PROTIME-INR
INR: 0.96 (ref 0.00–1.49)
Prothrombin Time: 13 seconds (ref 11.6–15.2)

## 2016-01-30 LAB — TYPE AND SCREEN
ABO/RH(D): O POS
ANTIBODY SCREEN: NEGATIVE

## 2016-01-30 LAB — ABO/RH: ABO/RH(D): O POS

## 2016-01-30 LAB — APTT: APTT: 26 s (ref 24–37)

## 2016-01-30 SURGERY — OSTEOTOMY, TIBIA
Anesthesia: General

## 2016-01-30 MED ORDER — CEFAZOLIN SODIUM 1 G IJ SOLR
INTRAMUSCULAR | Status: AC
  Filled 2016-01-30: qty 20

## 2016-01-30 MED ORDER — HYDROMORPHONE HCL 1 MG/ML IJ SOLN
INTRAMUSCULAR | Status: AC
  Filled 2016-01-30: qty 1

## 2016-01-30 MED ORDER — CEFAZOLIN SODIUM-DEXTROSE 2-4 GM/100ML-% IV SOLN
2.0000 g | INTRAVENOUS | Status: AC
  Administered 2016-01-30 (×2): 2 g via INTRAVENOUS
  Filled 2016-01-30: qty 100

## 2016-01-30 MED ORDER — LACTATED RINGERS IV SOLN: INTRAVENOUS | Status: DC

## 2016-01-30 MED ORDER — DOCUSATE SODIUM 100 MG PO CAPS
100.0000 mg | ORAL_CAPSULE | Freq: Two times a day (BID) | ORAL | Status: DC
  Administered 2016-01-30 – 2016-02-02 (×4): 100 mg via ORAL
  Filled 2016-01-30 (×6): qty 1

## 2016-01-30 MED ORDER — OXYCODONE HCL 5 MG PO TABS
5.0000 mg | ORAL_TABLET | ORAL | Status: DC | PRN
  Administered 2016-01-30 – 2016-02-02 (×8): 10 mg via ORAL
  Filled 2016-01-30 (×7): qty 2

## 2016-01-30 MED ORDER — ROCURONIUM BROMIDE 100 MG/10ML IV SOLN
INTRAVENOUS | Status: DC | PRN
  Administered 2016-01-30: 35 mg via INTRAVENOUS
  Administered 2016-01-30: 15 mg via INTRAVENOUS

## 2016-01-30 MED ORDER — ACETAMINOPHEN 500 MG PO TABS
1000.0000 mg | ORAL_TABLET | Freq: Once | ORAL | Status: DC
  Filled 2016-01-30: qty 2

## 2016-01-30 MED ORDER — GLYCOPYRROLATE 0.2 MG/ML IJ SOLN
INTRAMUSCULAR | Status: DC | PRN
  Administered 2016-01-30: .2 mg via INTRAVENOUS

## 2016-01-30 MED ORDER — MIDAZOLAM HCL 5 MG/5ML IJ SOLN
INTRAMUSCULAR | Status: DC | PRN
Start: 2016-01-30 — End: 2016-01-30
  Administered 2016-01-30: 2 mg via INTRAVENOUS

## 2016-01-30 MED ORDER — ACETAMINOPHEN 325 MG PO TABS
650.0000 mg | ORAL_TABLET | Freq: Four times a day (QID) | ORAL | Status: DC | PRN
  Filled 2016-01-30: qty 2

## 2016-01-30 MED ORDER — LABETALOL HCL 5 MG/ML IV SOLN
INTRAVENOUS | Status: AC
  Filled 2016-01-30: qty 4

## 2016-01-30 MED ORDER — ONDANSETRON HCL 4 MG/2ML IJ SOLN: 4.0000 mg | Freq: Once | INTRAMUSCULAR | Status: DC | PRN

## 2016-01-30 MED ORDER — MIDAZOLAM HCL 2 MG/2ML IJ SOLN
INTRAMUSCULAR | Status: AC
  Filled 2016-01-30: qty 2

## 2016-01-30 MED ORDER — GLYCOPYRROLATE 0.2 MG/ML IJ SOLN
INTRAMUSCULAR | Status: AC
  Filled 2016-01-30: qty 1

## 2016-01-30 MED ORDER — POTASSIUM CHLORIDE IN NACL 20-0.9 MEQ/L-% IV SOLN
INTRAVENOUS | Status: DC
  Administered 2016-01-31: 04:00:00 via INTRAVENOUS
  Filled 2016-01-30: qty 1000

## 2016-01-30 MED ORDER — BUPIVACAINE HCL (PF) 0.25 % IJ SOLN
INTRAMUSCULAR | Status: AC
  Filled 2016-01-30: qty 30

## 2016-01-30 MED ORDER — BUPIVACAINE-EPINEPHRINE (PF) 0.5% -1:200000 IJ SOLN
INTRAMUSCULAR | Status: AC
  Filled 2016-01-30: qty 30

## 2016-01-30 MED ORDER — ONDANSETRON HCL 4 MG PO TABS: 4.0000 mg | ORAL_TABLET | Freq: Four times a day (QID) | ORAL | Status: DC | PRN

## 2016-01-30 MED ORDER — FENTANYL CITRATE (PF) 250 MCG/5ML IJ SOLN
INTRAMUSCULAR | Status: AC
  Filled 2016-01-30: qty 5

## 2016-01-30 MED ORDER — OXYCODONE HCL 5 MG PO TABS
ORAL_TABLET | ORAL | Status: AC
  Filled 2016-01-30: qty 2

## 2016-01-30 MED ORDER — FENTANYL CITRATE (PF) 100 MCG/2ML IJ SOLN
INTRAMUSCULAR | Status: DC | PRN
  Administered 2016-01-30: 50 ug via INTRAVENOUS
  Administered 2016-01-30: 100 ug via INTRAVENOUS
  Administered 2016-01-30 (×4): 50 ug via INTRAVENOUS
  Administered 2016-01-30: 100 ug via INTRAVENOUS
  Administered 2016-01-30: 150 ug via INTRAVENOUS

## 2016-01-30 MED ORDER — METHOCARBAMOL 1000 MG/10ML IJ SOLN
500.0000 mg | Freq: Four times a day (QID) | INTRAVENOUS | Status: DC | PRN
  Filled 2016-01-30: qty 5

## 2016-01-30 MED ORDER — BUPIVACAINE-EPINEPHRINE 0.5% -1:200000 IJ SOLN
INTRAMUSCULAR | Status: DC | PRN
  Administered 2016-01-30: 10 mL

## 2016-01-30 MED ORDER — LIDOCAINE HCL (CARDIAC) 20 MG/ML IV SOLN
INTRAVENOUS | Status: DC | PRN
  Administered 2016-01-30: 60 mg via INTRAVENOUS

## 2016-01-30 MED ORDER — HYDROCODONE-ACETAMINOPHEN 7.5-325 MG PO TABS
1.0000 | ORAL_TABLET | Freq: Four times a day (QID) | ORAL | Status: DC
  Administered 2016-01-30 – 2016-02-01 (×7): 2 via ORAL
  Filled 2016-01-30 (×7): qty 2

## 2016-01-30 MED ORDER — NEOSTIGMINE METHYLSULFATE 10 MG/10ML IV SOLN
INTRAVENOUS | Status: DC | PRN
  Administered 2016-01-30: 2 mg via INTRAVENOUS

## 2016-01-30 MED ORDER — 0.9 % SODIUM CHLORIDE (POUR BTL) OPTIME
TOPICAL | Status: DC | PRN
  Administered 2016-01-30 (×2): 1000 mL

## 2016-01-30 MED ORDER — LACTATED RINGERS IV SOLN
INTRAVENOUS | Status: DC | PRN
  Administered 2016-01-30 (×2): via INTRAVENOUS

## 2016-01-30 MED ORDER — ARTIFICIAL TEARS OP OINT
TOPICAL_OINTMENT | OPHTHALMIC | Status: DC | PRN
  Administered 2016-01-30: 1 via OPHTHALMIC

## 2016-01-30 MED ORDER — CEFAZOLIN SODIUM 1-5 GM-% IV SOLN
1.0000 g | Freq: Four times a day (QID) | INTRAVENOUS | Status: AC
  Administered 2016-01-30 – 2016-01-31 (×3): 1 g via INTRAVENOUS
  Filled 2016-01-30 (×3): qty 50

## 2016-01-30 MED ORDER — PROPOFOL 10 MG/ML IV BOLUS
INTRAVENOUS | Status: DC | PRN
  Administered 2016-01-30: 200 mg via INTRAVENOUS

## 2016-01-30 MED ORDER — ONDANSETRON HCL 4 MG/2ML IJ SOLN
4.0000 mg | Freq: Four times a day (QID) | INTRAMUSCULAR | Status: DC | PRN
  Administered 2016-01-31: 4 mg via INTRAVENOUS
  Filled 2016-01-30: qty 2

## 2016-01-30 MED ORDER — HYDRALAZINE HCL 20 MG/ML IJ SOLN
INTRAMUSCULAR | Status: DC | PRN
  Administered 2016-01-30 (×4): 4 mg via INTRAVENOUS

## 2016-01-30 MED ORDER — PROPOFOL 10 MG/ML IV BOLUS
INTRAVENOUS | Status: AC
  Filled 2016-01-30: qty 20

## 2016-01-30 MED ORDER — MAGNESIUM HYDROXIDE 400 MG/5ML PO SUSP
30.0000 mL | Freq: Every day | ORAL | Status: DC | PRN
  Administered 2016-02-01: 30 mL via ORAL
  Filled 2016-01-30: qty 30

## 2016-01-30 MED ORDER — BISACODYL 5 MG PO TBEC
5.0000 mg | DELAYED_RELEASE_TABLET | Freq: Every day | ORAL | Status: DC | PRN
  Administered 2016-02-02: 5 mg via ORAL
  Filled 2016-01-30: qty 1

## 2016-01-30 MED ORDER — NEOSTIGMINE METHYLSULFATE 10 MG/10ML IV SOLN
INTRAVENOUS | Status: AC
Start: 2016-01-30 — End: 2016-01-30
  Filled 2016-01-30: qty 1

## 2016-01-30 MED ORDER — HYDRALAZINE HCL 20 MG/ML IJ SOLN
INTRAMUSCULAR | Status: AC
  Filled 2016-01-30: qty 1

## 2016-01-30 MED ORDER — CHLORHEXIDINE GLUCONATE 4 % EX LIQD: 60.0000 mL | Freq: Once | CUTANEOUS | Status: DC

## 2016-01-30 MED ORDER — METOCLOPRAMIDE HCL 5 MG PO TABS: 5.0000 mg | ORAL_TABLET | Freq: Three times a day (TID) | ORAL | Status: DC | PRN

## 2016-01-30 MED ORDER — LABETALOL HCL 5 MG/ML IV SOLN
INTRAVENOUS | Status: DC | PRN
Start: 2016-01-30 — End: 2016-01-30
  Administered 2016-01-30 (×7): 5 mg via INTRAVENOUS

## 2016-01-30 MED ORDER — ONDANSETRON HCL 4 MG/2ML IJ SOLN
INTRAMUSCULAR | Status: DC | PRN
  Administered 2016-01-30: 4 mg via INTRAVENOUS

## 2016-01-30 MED ORDER — LIDOCAINE HCL (CARDIAC) 20 MG/ML IV SOLN
INTRAVENOUS | Status: AC
  Filled 2016-01-30: qty 5

## 2016-01-30 MED ORDER — HEMOSTATIC AGENTS (NO CHARGE) OPTIME
TOPICAL | Status: DC | PRN
  Administered 2016-01-30: 1 via TOPICAL

## 2016-01-30 MED ORDER — HYDROMORPHONE HCL 1 MG/ML IJ SOLN
INTRAMUSCULAR | Status: AC
  Administered 2016-01-30: 0.5 mg via INTRAVENOUS
  Filled 2016-01-30: qty 1

## 2016-01-30 MED ORDER — ACETAMINOPHEN 650 MG RE SUPP: 650.0000 mg | Freq: Four times a day (QID) | RECTAL | Status: DC | PRN

## 2016-01-30 MED ORDER — HYDROMORPHONE HCL 1 MG/ML IJ SOLN
0.5000 mg | INTRAMUSCULAR | Status: AC | PRN
  Administered 2016-01-30 (×4): 0.5 mg via INTRAVENOUS

## 2016-01-30 MED ORDER — HYDROMORPHONE HCL 1 MG/ML IJ SOLN
1.0000 mg | INTRAMUSCULAR | Status: DC | PRN
  Administered 2016-01-30: 1 mg via INTRAVENOUS
  Administered 2016-01-30 – 2016-02-02 (×11): 2 mg via INTRAVENOUS
  Filled 2016-01-30 (×6): qty 2
  Filled 2016-01-30: qty 1
  Filled 2016-01-30 (×5): qty 2

## 2016-01-30 MED ORDER — METHOCARBAMOL 500 MG PO TABS
500.0000 mg | ORAL_TABLET | Freq: Four times a day (QID) | ORAL | Status: DC | PRN
  Administered 2016-01-31 – 2016-02-01 (×3): 1000 mg via ORAL
  Filled 2016-01-30 (×4): qty 2
  Filled 2016-01-30: qty 1
  Filled 2016-01-30: qty 2

## 2016-01-30 MED ORDER — ENOXAPARIN SODIUM 40 MG/0.4ML ~~LOC~~ SOLN
40.0000 mg | SUBCUTANEOUS | Status: DC
  Administered 2016-01-31 – 2016-02-02 (×3): 40 mg via SUBCUTANEOUS
  Filled 2016-01-30 (×4): qty 0.4

## 2016-01-30 MED ORDER — METOCLOPRAMIDE HCL 5 MG/ML IJ SOLN: 5.0000 mg | Freq: Three times a day (TID) | INTRAMUSCULAR | Status: DC | PRN

## 2016-01-30 SURGICAL SUPPLY — 98 items
BANDAGE ELASTIC 4 VELCRO ST LF (GAUZE/BANDAGES/DRESSINGS) ×4 IMPLANT
BANDAGE ELASTIC 6 VELCRO ST LF (GAUZE/BANDAGES/DRESSINGS) ×4 IMPLANT
BANDAGE ESMARK 6X9 LF (GAUZE/BANDAGES/DRESSINGS) ×2 IMPLANT
BENZOIN TINCTURE PRP APPL 2/3 (GAUZE/BANDAGES/DRESSINGS) ×4 IMPLANT
BLADE SURG 10 STRL SS (BLADE) ×8 IMPLANT
BLADE SURG 15 STRL LF DISP TIS (BLADE) ×2 IMPLANT
BLADE SURG 15 STRL SS (BLADE) ×2
BLADE SURG CLIPPER 3M 9600 (MISCELLANEOUS) ×4 IMPLANT
BLADE SURG ROTATE 9660 (MISCELLANEOUS) IMPLANT
BNDG COHESIVE 4X5 TAN STRL (GAUZE/BANDAGES/DRESSINGS) ×4 IMPLANT
BNDG COHESIVE 6X5 TAN STRL LF (GAUZE/BANDAGES/DRESSINGS) ×4 IMPLANT
BNDG ESMARK 6X9 LF (GAUZE/BANDAGES/DRESSINGS) ×4
BNDG GAUZE ELAST 4 BULKY (GAUZE/BANDAGES/DRESSINGS) ×8 IMPLANT
BONE CANC CHIPS 20CC PCAN1/4 (Bone Implant) ×4 IMPLANT
BRUSH SCRUB DISP (MISCELLANEOUS) ×8 IMPLANT
CHIPS CANC BONE 20CC PCAN1/4 (Bone Implant) ×2 IMPLANT
CLEANER TIP ELECTROSURG 2X2 (MISCELLANEOUS) ×4 IMPLANT
CLOSURE STERI-STRIP 1/2X4 (GAUZE/BANDAGES/DRESSINGS) ×1
CLOSURE WOUND 1/2 X4 (GAUZE/BANDAGES/DRESSINGS)
CLSR STERI-STRIP ANTIMIC 1/2X4 (GAUZE/BANDAGES/DRESSINGS) ×3 IMPLANT
CONT SPEC 4OZ CLIKSEAL STRL BL (MISCELLANEOUS) ×4 IMPLANT
COVER MAYO STAND STRL (DRAPES) ×4 IMPLANT
COVER SURGICAL LIGHT HANDLE (MISCELLANEOUS) ×4 IMPLANT
CUFF TOURNIQUET SINGLE 34IN LL (TOURNIQUET CUFF) ×4 IMPLANT
DRAPE C-ARM 42X72 X-RAY (DRAPES) ×4 IMPLANT
DRAPE C-ARMOR (DRAPES) ×4 IMPLANT
DRAPE IMP U-DRAPE 54X76 (DRAPES) ×4 IMPLANT
DRAPE INCISE IOBAN 66X45 STRL (DRAPES) ×4 IMPLANT
DRAPE OEC MINIVIEW 54X84 (DRAPES) ×4 IMPLANT
DRAPE ORTHO SPLIT 77X108 STRL (DRAPES) ×2
DRAPE SURG ORHT 6 SPLT 77X108 (DRAPES) ×2 IMPLANT
DRAPE U-SHAPE 47X51 STRL (DRAPES) ×4 IMPLANT
DRSG ADAPTIC 3X8 NADH LF (GAUZE/BANDAGES/DRESSINGS) ×4 IMPLANT
DRSG MEPILEX BORDER 4X4 (GAUZE/BANDAGES/DRESSINGS) ×4 IMPLANT
DRSG PAD ABDOMINAL 8X10 ST (GAUZE/BANDAGES/DRESSINGS) ×16 IMPLANT
ELECT REM PT RETURN 9FT ADLT (ELECTROSURGICAL) ×4
ELECTRODE REM PT RTRN 9FT ADLT (ELECTROSURGICAL) ×2 IMPLANT
EVACUATOR 1/8 PVC DRAIN (DRAIN) ×4 IMPLANT
EVACUATOR 3/16  PVC DRAIN (DRAIN)
EVACUATOR 3/16 PVC DRAIN (DRAIN) IMPLANT
GAUZE SPONGE 4X4 12PLY STRL (GAUZE/BANDAGES/DRESSINGS) ×4 IMPLANT
GLOVE BIO SURGEON STRL SZ7.5 (GLOVE) ×8 IMPLANT
GLOVE BIO SURGEON STRL SZ8 (GLOVE) ×4 IMPLANT
GLOVE BIOGEL PI IND STRL 7.5 (GLOVE) ×2 IMPLANT
GLOVE BIOGEL PI IND STRL 8 (GLOVE) ×2 IMPLANT
GLOVE BIOGEL PI INDICATOR 7.5 (GLOVE) ×2
GLOVE BIOGEL PI INDICATOR 8 (GLOVE) ×2
GLOVE PROGUARD SZ 7 1/2 (GLOVE) ×4 IMPLANT
GOWN EXTRA PROTECTION XXL 0583 (GOWNS) ×4 IMPLANT
GOWN STRL REUS W/ TWL LRG LVL3 (GOWN DISPOSABLE) ×4 IMPLANT
GOWN STRL REUS W/ TWL XL LVL3 (GOWN DISPOSABLE) ×2 IMPLANT
GOWN STRL REUS W/TWL LRG LVL3 (GOWN DISPOSABLE) ×4
GOWN STRL REUS W/TWL XL LVL3 (GOWN DISPOSABLE) ×2
IMMOBILIZER KNEE 22 UNIV (SOFTGOODS) ×4 IMPLANT
KIT BASIN OR (CUSTOM PROCEDURE TRAY) ×4 IMPLANT
KIT INFUSE LRG II (Orthopedic Implant) ×4 IMPLANT
KIT ROOM TURNOVER OR (KITS) ×4 IMPLANT
MANIFOLD NEPTUNE II (INSTRUMENTS) ×4 IMPLANT
NDL SUT 6 .5 CRC .975X.05 MAYO (NEEDLE) ×2 IMPLANT
NEEDLE 22X1 1/2 (OR ONLY) (NEEDLE) ×4 IMPLANT
NEEDLE MAYO TAPER (NEEDLE) ×2
NS IRRIG 1000ML POUR BTL (IV SOLUTION) ×4 IMPLANT
PACK ORTHO EXTREMITY (CUSTOM PROCEDURE TRAY) ×4 IMPLANT
PACK UNIVERSAL I (CUSTOM PROCEDURE TRAY) ×4 IMPLANT
PAD ARMBOARD 7.5X6 YLW CONV (MISCELLANEOUS) ×8 IMPLANT
PAD CAST 4YDX4 CTTN HI CHSV (CAST SUPPLIES) ×2 IMPLANT
PADDING CAST COTTON 4X4 STRL (CAST SUPPLIES) ×2
PADDING CAST COTTON 6X4 STRL (CAST SUPPLIES) ×12 IMPLANT
PENCIL BUTTON HOLSTER BLD 10FT (ELECTRODE) ×4 IMPLANT
SPONGE LAP 18X18 X RAY DECT (DISPOSABLE) ×12 IMPLANT
SPONGE SCRUB IODOPHOR (GAUZE/BANDAGES/DRESSINGS) ×4 IMPLANT
SPONGE SURGIFOAM ABS GEL SZ50 (HEMOSTASIS) ×4 IMPLANT
STAPLER VISISTAT 35W (STAPLE) ×4 IMPLANT
STOCKINETTE IMPERVIOUS 9X36 MD (GAUZE/BANDAGES/DRESSINGS) ×4 IMPLANT
STOCKINETTE IMPERVIOUS LG (DRAPES) ×4 IMPLANT
STRIP CLOSURE SKIN 1/2X4 (GAUZE/BANDAGES/DRESSINGS) IMPLANT
SUCTION FRAZIER HANDLE 10FR (MISCELLANEOUS) ×4
SUCTION TUBE FRAZIER 10FR DISP (MISCELLANEOUS) ×4 IMPLANT
SUT ETHILON 3 0 PS 1 (SUTURE) ×24 IMPLANT
SUT PDS AB 0 CT 36 (SUTURE) ×4 IMPLANT
SUT PDS AB 2-0 CT1 27 (SUTURE) IMPLANT
SUT PROLENE 0 CT 2 (SUTURE) ×8 IMPLANT
SUT VIC AB 0 CT1 27 (SUTURE) ×4
SUT VIC AB 0 CT1 27XBRD ANBCTR (SUTURE) ×4 IMPLANT
SUT VIC AB 1 CT1 27 (SUTURE) ×6
SUT VIC AB 1 CT1 27XBRD ANBCTR (SUTURE) ×6 IMPLANT
SUT VIC AB 2-0 CT1 27 (SUTURE) ×6
SUT VIC AB 2-0 CT1 TAPERPNT 27 (SUTURE) ×6 IMPLANT
SUT VIC AB 2-0 FS1 27 (SUTURE) ×4 IMPLANT
SYR 20ML ECCENTRIC (SYRINGE) IMPLANT
SYR BULB IRRIGATION 50ML (SYRINGE) ×4 IMPLANT
SYR CONTROL 10ML LL (SYRINGE) ×4 IMPLANT
TOWEL OR 17X24 6PK STRL BLUE (TOWEL DISPOSABLE) ×4 IMPLANT
TOWEL OR 17X26 10 PK STRL BLUE (TOWEL DISPOSABLE) ×16 IMPLANT
TUBE CONNECTING 12'X1/4 (SUCTIONS) ×1
TUBE CONNECTING 12X1/4 (SUCTIONS) ×3 IMPLANT
UNDERPAD 30X30 INCONTINENT (UNDERPADS AND DIAPERS) ×8 IMPLANT
YANKAUER SUCT BULB TIP NO VENT (SUCTIONS) ×4 IMPLANT

## 2016-01-30 NOTE — Anesthesia Postprocedure Evaluation (Signed)
Anesthesia Post Note  Patient: Christian Branch  Procedure(s) Performed: Procedure(s) (LRB): TIBIAL OSTEOTOMY (N/A) NON UNION TIBIAL REPAIR (N/A) HARDWARE REMOVAL LEFT TIBIAL (Left)  Patient location during evaluation: PACU Anesthesia Type: General Level of consciousness: awake, awake and alert, oriented and patient cooperative Pain management: pain level controlled Vital Signs Assessment: post-procedure vital signs reviewed and stable Respiratory status: spontaneous breathing and respiratory function stable Cardiovascular status: blood pressure returned to baseline Anesthetic complications: no    Last Vitals:  Filed Vitals:   01/30/16 1208 01/30/16 1211  BP: 156/99   Pulse: 85   Temp:  36.6 C  Resp: 15     Last Pain:  Filed Vitals:   01/30/16 1216  PainSc: 8     LLE Motor Response: Responds to commands (01/30/16 1211)            Karina Nofsinger EDWARD
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
## 2016-01-30 NOTE — Brief Op Note (Signed)
01/30/2016  12:11 PM  PATIENT:  Christian Branch  40 y.o. male  PRE-OPERATIVE DIAGNOSIS:   1. Left tibia intra-articular malunion 2. Left tibia metaphysis nonunion 3. Painful hardware 4. ACQUIRED EQUINUS CONTRACTURE   POST-OPERATIVE DIAGNOSIS:  1. Left tibia intra-articular malunion 2. Left tibia metaphysis nonunion 3. Painful hardware 4. ACQUIRED EQUINUS CONTRACTURE   PROCEDURE:  Procedure(s): 1. INTRAARTICULAR TIBIAL OSTEOTOMY (N/A) 2. NON UNION TIBIAL REPAIR (N/A) WITH AUTOLOGOUS ILIAC CREST BONE GRAFTING 3. HARDWARE REMOVAL LEFT TIBIAL (Left) 4. ACHILLES TENDON LENGTHENING  SURGEON:  Surgeon(s) and Role:    * Altamese [place], MD - Primary  PHYSICIAN ASSISTANT: [name], PA-C  ANESTHESIA:   general  I/O:  Total I/O In: 1000 [I.V.:1000] Out: 100 [Blood:100]  SPECIMEN:  Source of Specimen:  nonunion site tibial metaphysis  TOURNIQUET:   Total Tourniquet Time Documented: Thigh (Left) - 117 minutes Total: Thigh (Left) - 117 minutes   DICTATION: .Other Dictation: Dictation Number [id number]

## 2016-01-30 NOTE — Anesthesia Preprocedure Evaluation (Signed)
Anesthesia Evaluation  Patient identified by MRN, date of birth, ID band Patient awake    Reviewed: Allergy & Precautions, NPO status , Patient's Chart, lab work & pertinent test results  Airway Mallampati: I  TM Distance: >3 FB Neck ROM: Full    Dental   Pulmonary    Pulmonary exam normal        Cardiovascular Normal cardiovascular exam     Neuro/Psych    GI/Hepatic   Endo/Other    Renal/GU      Musculoskeletal   Abdominal   Peds  Hematology   Anesthesia Other Findings   Reproductive/Obstetrics                             Anesthesia Physical Anesthesia Plan  ASA: II  Anesthesia Plan: General   Post-op Pain Management:    Induction: Intravenous  Airway Management Planned: Oral ETT  Additional Equipment:   Intra-op Plan:   Post-operative Plan: Extubation in OR  Informed Consent: I have reviewed the patients History and Physical, chart, labs and discussed the procedure including the risks, benefits and alternatives for the proposed anesthesia with the patient or authorized representative who has indicated his/her understanding and acceptance.     Plan Discussed with: CRNA, Anesthesiologist and Surgeon  Anesthesia Plan Comments:         Anesthesia Quick Evaluation

## 2016-01-30 NOTE — Progress Notes (Signed)
Pt refused to let lab tech draw labs

## 2016-01-30 NOTE — Progress Notes (Signed)
Orthopedic Tech Progress Note Patient Details:  Christian Branch 06/09/1976 [id number]  Ortho Devices Type of Ortho Device: Postop shoe/boot Ortho Device/Splint Interventions: Application   [place] Pink 01/30/2016, 3:56 PM

## 2016-01-30 NOTE — Anesthesia Procedure Notes (Signed)
Procedure Name: Intubation Date/Time: 01/30/2016 8:04 AM Performed by: Izora Gala Pre-anesthesia Checklist: Patient identified, Emergency Drugs available, Suction available and Patient being monitored Patient Re-evaluated:Patient Re-evaluated prior to inductionOxygen Delivery Method: Circle system utilized Preoxygenation: Pre-oxygenation with 100% oxygen Intubation Type: IV induction Ventilation: Mask ventilation without difficulty Laryngoscope Size: Miller and 3 Grade View: Grade I Tube type: Oral Tube size: 7.5 mm Number of attempts: 1 Airway Equipment and Method: Stylet and LTA kit utilized Placement Confirmation: ETT inserted through vocal cords under direct vision,  positive ETCO2 and breath sounds checked- equal and bilateral Secured at: 22 cm Tube secured with: Tape Dental Injury: Teeth and Oropharynx as per pre-operative assessment

## 2016-01-30 NOTE — Transfer of Care (Signed)
Immediate Anesthesia Transfer of Care Note  Patient: Christian Branch  Procedure(s) Performed: Procedure(s): TIBIAL OSTEOTOMY (N/A) NON UNION TIBIAL REPAIR (N/A) HARDWARE REMOVAL LEFT TIBIAL (Left)  Patient Location: PACU  Anesthesia Type:General  Level of Consciousness: awake, alert  and patient cooperative  Airway & Oxygen Therapy: Patient Spontanous Breathing and Patient connected to nasal cannula oxygen  Post-op Assessment: Report given to RN, Post -op Vital signs reviewed and stable and Patient moving all extremities  Post vital signs: Reviewed and stable  Last Vitals:  Filed Vitals:   01/30/16 0617 01/30/16 0621  BP:  200/113  Pulse: 71   Temp: 36.9 C   Resp: 18     Complications: No apparent anesthesia complications

## 2016-01-31 ENCOUNTER — Encounter (HOSPITAL_COMMUNITY): Payer: Self-pay | Admitting: Orthopedic Surgery

## 2016-01-31 LAB — URINALYSIS, ROUTINE W REFLEX MICROSCOPIC
Bilirubin Urine: NEGATIVE
Glucose, UA: NEGATIVE mg/dL
Hgb urine dipstick: NEGATIVE
Ketones, ur: NEGATIVE mg/dL
LEUKOCYTES UA: NEGATIVE
NITRITE: NEGATIVE
PH: 6 (ref 5.0–8.0)
Protein, ur: NEGATIVE mg/dL
SPECIFIC GRAVITY, URINE: 1.019 (ref 1.005–1.030)

## 2016-01-31 LAB — VITAMIN D 25 HYDROXY (VIT D DEFICIENCY, FRACTURES): Vit D, 25-Hydroxy: 14.1 ng/mL — ABNORMAL LOW (ref 30.0–100.0)

## 2016-01-31 NOTE — Progress Notes (Signed)
Orthopaedic Trauma Service Progress Note  Subjective  Doing ok Sitting in bedside chair L ICBG site sore, L leg sore as well No other complaints  Again pt homeless. Bouncing around different locations to stay but mostly on the streets  Denies CP, SOB No N/V No abd pain   ROS  As above  Objective   BP 146/93 mmHg  Pulse 95  Temp(Src) 98.6 F (37 C) (Oral)  Resp 16  Ht 5\' 5"  (1.651 m)  Wt 72.576 kg (160 lb)  BMI 26.63 kg/m2  SpO2 97%  Intake/Output      04/06 0701 - 04/07 0700 04/07 0701 - 04/08 0700   P.O. 240    I.V. (mL/kg) 1000 (13.8)    Total Intake(mL/kg) 1240 (17.1)    Urine (mL/kg/hr) 1050 (0.6) 125 (0.6)   Drains 125 (0.1)    Blood 100 (0.1)    Total Output 1275 125   Net -35 -125          Labs  Pt refused labs this am   Exam  Gen: Awake and alert, NAD Lungs: breathing unlabored  Cardiac: regular Abd: NT Pelvis: dressing L ICBG sites table  Ext:       Left Lower Extremity   Dressing c/d/i  Drain patent  SLC fitting well  Distal motor and sensory functions intact  Ext warm  Brisk cap refill    Assessment and Plan   POD/HD#: 1  40 y/o black male with 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, ROH and achilles tendon lengthening    -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 :  S/p ROH, repair of nonunion with ICBG, achilles tendon lengthening    PWB L leg with crutches or walker  PT/OT evals  Will remain in cast for 2-3 weeks  Dc drain tomorrow   - Pain management:  Continue with current regimen   - Hemodynamics  Stable   - Medical issues/social issues    SW consult   - DVT/PE prophylaxis:  lovenox while inpatient   - ID:   periop abx   - Activity:  As above   - FEN/GI prophylaxis/Foley/Lines:  Diet as tolerated   -Ex-fix/Splint care:  Do not get cast wet   - Dispo:  PT/OT evals  SW eval    Jari Pigg, PA-C [facility] [phone] (P) [phone] (O) 01/31/2016 9:46 AM

## 2016-01-31 NOTE — Progress Notes (Signed)
Utilization review completed.  
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
## 2016-01-31 NOTE — Progress Notes (Signed)
Patient expressed concerns regarding affordable housing for himself.  I placed a Social Work Scientific laboratory technician.
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
## 2016-02-01 MED ORDER — OXYCODONE-ACETAMINOPHEN 5-325 MG PO TABS
1.0000 | ORAL_TABLET | Freq: Four times a day (QID) | ORAL | Status: DC | PRN
Start: 2016-02-01 — End: 2016-02-02
  Administered 2016-02-01: 2 via ORAL
  Filled 2016-02-01: qty 2

## 2016-02-01 MED ORDER — HYDROCHLOROTHIAZIDE 12.5 MG PO CAPS
12.5000 mg | ORAL_CAPSULE | Freq: Every day | ORAL | Status: DC
  Administered 2016-02-01 – 2016-02-02 (×2): 12.5 mg via ORAL
  Filled 2016-02-01 (×2): qty 1
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
## 2016-02-01 NOTE — Progress Notes (Signed)
Orthopaedic Trauma Service Progress Note  Subjective  Doing well this morning Sitting in bedside chair Thinks po pain meds making him itch- back and chest Waiting for SW consult   Review of Systems  Constitutional: Negative for fever and chills.  Respiratory: Negative for shortness of breath and wheezing.   Cardiovascular: Negative for chest pain and palpitations.  Gastrointestinal: Negative for nausea, vomiting and abdominal pain.  Genitourinary: Negative for dysuria.  Skin: Positive for itching.  Neurological: Negative for headaches.     Objective   BP 152/95 mmHg  Pulse 98  Temp(Src) 98.5 F (36.9 C) (Oral)  Resp 16  Ht 5\' 5"  (1.651 m)  Wt 72.576 kg (160 lb)  BMI 26.63 kg/m2  SpO2 97%  Intake/Output      04/07 0701 - 04/08 0700 04/08 0701 - 04/09 0700   P.O. 1200    I.V. (mL/kg) 275.8 (3.8)    Total Intake(mL/kg) 1475.8 (20.3)    Urine (mL/kg/hr) 1075 (0.6)    Drains 20 (0)    Blood     Total Output 1095     Net +380.8            Labs No new labs   Exam Gen: Awake and alert, NAD, sitting in bedside chair  Lungs: breathing unlabored   Cardiac: regular Abd: NT Pelvis: dressing L ICBG site stable   Ext:        Left Lower Extremity               Dressing with some drainage but stable overall              Drain patent, removed by me today              SLC fitting well             Distal motor and sensory functions intact             Ext warm             Brisk cap refill     Assessment and Plan   POD/HD#: 2   40 y/o black male with 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, ROH and achilles tendon lengthening     -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 :             S/p ROH, repair of nonunion with ICBG, achilles tendon lengthening               PWB L leg with crutches or walker             PT/OT evals             Will remain in cast for 2-3 weeks  Dressing  change to L knee and L hip tomorrow              Dc'd drain  - Pain management:             dc norco to see if this helps with itching  Change to percocet   - Hemodynamics             Stable   - Medical issues/social issues                SW consult   - DVT/PE prophylaxis:             lovenox while inpatient   - ID:  periop abx   - Activity:             As above   - FEN/GI prophylaxis/Foley/Lines:             Diet as tolerated   -Ex-fix/Splint care:             Do not get cast wet   - Dispo:             PT/OT              SW eval      Jari Pigg, PA-C [facility] [phone] (O) 02/01/2016 9:38 AM
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
## 2016-02-02 ENCOUNTER — Encounter (HOSPITAL_COMMUNITY): Payer: Self-pay | Admitting: Orthopedic Surgery

## 2016-02-02 DIAGNOSIS — I10 Essential (primary) hypertension: Secondary | ICD-10-CM | POA: Diagnosis present

## 2016-02-02 MED ORDER — METHOCARBAMOL 500 MG PO TABS: 500.0000 mg | ORAL_TABLET | Freq: Four times a day (QID) | ORAL | Status: AC | PRN

## 2016-02-02 MED ORDER — OXYCODONE-ACETAMINOPHEN 5-325 MG PO TABS: 1.0000 | ORAL_TABLET | Freq: Four times a day (QID) | ORAL | Status: AC | PRN

## 2016-02-02 MED ORDER — OXYCODONE HCL 5 MG PO TABS: 5.0000 mg | ORAL_TABLET | Freq: Four times a day (QID) | ORAL | Status: AC | PRN

## 2016-02-02 MED ORDER — HYDROCHLOROTHIAZIDE 12.5 MG PO CAPS: 12.5000 mg | ORAL_CAPSULE | Freq: Every day | ORAL | Status: AC

## 2016-02-02 MED ORDER — DOCUSATE SODIUM 100 MG PO CAPS: 100.0000 mg | ORAL_CAPSULE | Freq: Two times a day (BID) | ORAL | Status: AC

## 2016-02-02 NOTE — Discharge Instructions (Signed)
Orthopaedic Trauma Service Discharge Instructions   General Discharge Instructions  WEIGHT BEARING STATUS: partial weightbearing Left leg  RANGE OF MOTION/ACTIVITY: knee range of motion as tolerated Left side  Wound Care: do not remove dressing from Left leg. Keep cast clean and dry. Ok to change dressing on L hip and was that wound with soap and water   PAIN MEDICATION USE AND EXPECTATIONS  You have likely been given narcotic medications to help control your pain.  After a traumatic event that results in an fracture (broken bone) with or without surgery, it is ok to use narcotic pain medications to help control one's pain.  We understand that everyone responds to pain differently and each individual patient will be evaluated on a regular basis for the continued need for narcotic medications. Ideally, narcotic medication use should last no more than 6-8 weeks (coinciding with fracture healing).   As a patient it is your responsibility as well to monitor narcotic medication use and report the amount and frequency you use these medications when you come to your office visit.   We would also advise that if you are using narcotic medications, you should take a dose prior to therapy to maximize you participation.  IF YOU ARE ON NARCOTIC MEDICATIONS IT IS NOT PERMISSIBLE TO OPERATE A MOTOR VEHICLE (MOTORCYCLE/CAR/TRUCK/MOPED) OR HEAVY MACHINERY DO NOT MIX NARCOTICS WITH OTHER CNS (CENTRAL NERVOUS SYSTEM) DEPRESSANTS SUCH AS ALCOHOL  Diet: as you were eating previously.  Can use over the counter stool softeners and bowel preparations, such as Miralax, to help with bowel movements.  Narcotics can be constipating.  Be sure to drink plenty of fluids    STOP SMOKING OR USING NICOTINE PRODUCTS!!!!  As discussed nicotine severely impairs your body's ability to heal surgical and traumatic wounds but also impairs bone healing.  Wounds and bone heal by forming microscopic blood vessels (angiogenesis) and  nicotine is a vasoconstrictor (essentially, shrinks blood vessels).  Therefore, if vasoconstriction occurs to these microscopic blood vessels they essentially disappear and are unable to deliver necessary nutrients to the healing tissue.  This is one modifiable factor that you can do to dramatically increase your chances of healing your injury.    (This means no smoking, no nicotine gum, patches, etc)  DO NOT USE NONSTEROIDAL ANTI-INFLAMMATORY DRUGS (NSAID'S)  Using products such as Advil (ibuprofen), Aleve (naproxen), Motrin (ibuprofen) for additional pain control during fracture healing can delay and/or prevent the healing response.  If you would like to take over the counter (OTC) medication, Tylenol (acetaminophen) is ok.  However, some narcotic medications that are given for pain control contain acetaminophen as well. Therefore, you should not exceed more than 4000 mg of tylenol in a day if you do not have liver disease.  Also note that there are may OTC medicines, such as cold medicines and allergy medicines that my contain tylenol as well.  If you have any questions about medications and/or interactions please ask your doctor/PA or your pharmacist.      ICE AND ELEVATE INJURED/OPERATIVE EXTREMITY  Using ice and elevating the injured extremity above your heart can help with swelling and pain control.  Icing in a pulsatile fashion, such as 20 minutes on and 20 minutes off, can be followed.    Do not place ice directly on skin. Make sure there is a barrier between to skin and the ice pack.    Using frozen items such as frozen peas works well as the conform nicely to the are that  needs to be iced.  USE AN ACE WRAP OR TED HOSE FOR SWELLING CONTROL  In addition to icing and elevation, Ace wraps or TED hose are used to help limit and resolve swelling.  It is recommended to use Ace wraps or TED hose until you are informed to stop.    When using Ace Wraps start the wrapping distally (farthest away from  the body) and wrap proximally (closer to the body)   Example: If you had surgery on your leg or thing and you do not have a splint on, start the ace wrap at the toes and work your way up to the thigh        If you had surgery on your upper extremity and do not have a splint on, start the ace wrap at your fingers and work your way up to the upper arm  IF YOU ARE IN A SPLINT OR CAST DO NOT [place]   If your splint gets wet for any reason please contact the office immediately. You may shower in your splint or cast as long as you keep it dry.  This can be done by wrapping in a cast cover or garbage back (or similar)  Do Not stick any thing down your splint or cast such as pencils, money, or hangers to try and scratch yourself with.  If you feel itchy take benadryl as prescribed on the bottle for itching  IF YOU ARE IN A CAM BOOT (BLACK BOOT)  You may remove boot periodically. Perform daily dressing changes as noted below.  Wash the liner of the boot regularly and wear a sock when wearing the boot. It is recommended that you sleep in the boot until told otherwise  CALL THE OFFICE WITH ANY QUESTIONS OR CONCERTS: [phone]       Discharge Wound Care Instructions  Do NOT apply any ointments, solutions or lotions to pin sites or surgical wounds.  These prevent needed drainage and even though solutions like hydrogen peroxide kill bacteria, they also damage cells lining the pin sites that help fight infection.  Applying lotions or ointments can keep the wounds moist and can cause them to breakdown and open up as well. This can increase the risk for infection. When in doubt call the office.  Surgical incisions should be dressed daily.  If any drainage is noted, use one layer of adaptic, then gauze, Kerlix, and an ace wrap.  Once the incision is completely dry and without drainage, it may be left open to air out.  Showering may begin 36-48 hours later.  Cleaning gently with soap and  water.  Traumatic wounds should be dressed daily as well.    One layer of adaptic, gauze, Kerlix, then ace wrap.  The adaptic can be discontinued once the draining has ceased    If you have a wet to dry dressing: wet the gauze with saline the squeeze as much saline out so the gauze is moist (not soaking wet), place moistened gauze over wound, then place a dry gauze over the moist one, followed by Kerlix wrap, then ace wrap.

## 2016-02-02 NOTE — Progress Notes (Signed)
Shelter list, free meal list and bus pass given to pt.  Creta Levin, [place] Coverage [phone]

## 2016-02-02 NOTE — Progress Notes (Signed)
Orthopaedic Trauma Service Progress Note  Subjective  Doing very well Met with SW yesterday and reviewed necessary info Pt will dc to sisters house  No specific complaints Very appreciative  Mobilized to bathroom on his own    Review of Systems  Constitutional: Negative for fever and chills.  HENT: Negative for nosebleeds.   Eyes: Negative for blurred vision and double vision.  Respiratory: Negative for shortness of breath and wheezing.   Cardiovascular: Negative for chest pain and palpitations.  Gastrointestinal: Negative for nausea and vomiting.  Genitourinary: Negative for dysuria.  Neurological: Negative for tingling, sensory change and headaches.     Objective   BP 153/88 mmHg  Pulse 88  Temp(Src) 98.8 F (37.1 C) (Oral)  Resp 16  Ht 5' 5"  (1.651 m)  Wt 72.576 kg (160 lb)  BMI 26.63 kg/m2  SpO2 99%  Intake/Output      04/08 0701 - 04/09 0700 04/09 0701 - 04/10 0700   P.O. 960    I.V. (mL/kg)     Total Intake(mL/kg) 960 (13.2)    Urine (mL/kg/hr) 300 (0.2)    Drains     Total Output 300     Net +660            Labs  No new labs   Exam Gen: Awake and alert, NAD, sitting in bedside chair  Lungs: breathing unlabored   Cardiac: regular Abd: NT Pelvis: dressing L ICBG site stable, incision looks great  Ext:        Left Lower Extremity               Dressing with some drainage but stable overall              SLC fitting well             Distal motor and sensory functions intact             Ext warm             Brisk cap refill     Assessment and Plan   POD/HD#: 53  40 y/o black male with 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, ROH and achilles tendon lengthening     -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 :             S/p ROH, repair of nonunion with ICBG, achilles tendon lengthening               PWB L leg with crutches or walker             PT/OT evals             Will remain in cast for 2-3 weeks             Dressing change to left hip today             ok to shower, cover cast with bag   - Pain management:             percocet and Oxy IR   - Hemodynamics             Stable   - Medical issues/social issues                SW consult   Pt to follow up on medicaid and disability    - DVT/PE prophylaxis:             lovenox  while inpatient   - ID:               periop abx   - Activity:             As above   - FEN/GI prophylaxis/Foley/Lines:             Diet as tolerated   -Ex-fix/Splint care:             Do not get cast wet   - Dispo:            dc home today   Follow up in office in 10 days     Jari Pigg, PA-C [facility] [phone] (O) 02/02/2016 9:54 AM

## 2016-02-02 NOTE — Progress Notes (Addendum)
CM met with pt who has a rolling walker in room and gave pt [place] letter to help defray cost of prescriptions with list of participating pharmacies; pt verbalized understanding of all MATCH parameters.  CSW provided transportation.  No other CM needs were communicated.

## 2016-02-03 LAB — TISSUE CULTURE

## 2016-02-03 NOTE — Progress Notes (Signed)
CSW spoke with patient regarding homeless resources. Provided resources such as [employer].  CSW signing off.  Christian Branch Errika Narvaiz LCSWA [phone]

## 2016-02-10 ENCOUNTER — Telehealth: Payer: Self-pay | Admitting: Emergency Medicine

## 2016-03-13 ENCOUNTER — Encounter (HOSPITAL_COMMUNITY): Payer: Self-pay | Admitting: Orthopedic Surgery

## 2016-03-13 DIAGNOSIS — M24572 Contracture, left ankle: Secondary | ICD-10-CM

## 2016-03-13 DIAGNOSIS — S82142R Displaced bicondylar fracture of left tibia, subsequent encounter for open fracture type IIIA, IIIB, or IIIC with malunion: Secondary | ICD-10-CM

## 2016-03-13 HISTORY — DX: Contracture, left ankle: M24.572

## 2016-03-13 HISTORY — DX: Displaced bicondylar fracture of left tibia, subsequent encounter for open fracture type IIIA, IIIB, or IIIC with malunion: S82.142R
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
